# Patient Record
Sex: Female | Born: 1970 | Race: Black or African American | Hispanic: No | Marital: Single | Smoking: Current some day smoker
Health system: Southern US, Community
[De-identification: ages and names within clinical notes are randomized; demographics above are authoritative.]

## PROBLEM LIST (undated history)

## (undated) DIAGNOSIS — D219 Benign neoplasm of connective and other soft tissue, unspecified: Secondary | ICD-10-CM

## (undated) DIAGNOSIS — T7840XA Allergy, unspecified, initial encounter: Secondary | ICD-10-CM

## (undated) DIAGNOSIS — G43909 Migraine, unspecified, not intractable, without status migrainosus: Secondary | ICD-10-CM

## (undated) DIAGNOSIS — M1990 Unspecified osteoarthritis, unspecified site: Secondary | ICD-10-CM

## (undated) DIAGNOSIS — N390 Urinary tract infection, site not specified: Secondary | ICD-10-CM

## (undated) DIAGNOSIS — R51 Headache: Secondary | ICD-10-CM

## (undated) DIAGNOSIS — R519 Headache, unspecified: Secondary | ICD-10-CM

## (undated) DIAGNOSIS — D649 Anemia, unspecified: Secondary | ICD-10-CM

## (undated) DIAGNOSIS — K219 Gastro-esophageal reflux disease without esophagitis: Secondary | ICD-10-CM

## (undated) HISTORY — DX: Migraine, unspecified, not intractable, without status migrainosus: G43.909

## (undated) HISTORY — DX: Headache, unspecified: R51.9

## (undated) HISTORY — DX: Urinary tract infection, site not specified: N39.0

## (undated) HISTORY — DX: Allergy, unspecified, initial encounter: T78.40XA

## (undated) HISTORY — PX: TUBAL LIGATION: SHX77

## (undated) HISTORY — DX: Gastro-esophageal reflux disease without esophagitis: K21.9

## (undated) HISTORY — DX: Headache: R51

---

## 1997-12-01 ENCOUNTER — Emergency Department (HOSPITAL_COMMUNITY): Admission: EM | Admit: 1997-12-01 | Discharge: 1997-12-01 | Payer: Self-pay | Admitting: Emergency Medicine

## 1998-03-04 ENCOUNTER — Emergency Department (HOSPITAL_COMMUNITY): Admission: EM | Admit: 1998-03-04 | Discharge: 1998-03-04 | Payer: Self-pay | Admitting: Emergency Medicine

## 1998-06-07 ENCOUNTER — Emergency Department (HOSPITAL_COMMUNITY): Admission: EM | Admit: 1998-06-07 | Discharge: 1998-06-07 | Payer: Self-pay | Admitting: Emergency Medicine

## 1998-10-27 ENCOUNTER — Emergency Department (HOSPITAL_COMMUNITY): Admission: EM | Admit: 1998-10-27 | Discharge: 1998-10-27 | Payer: Self-pay | Admitting: Emergency Medicine

## 1999-01-11 ENCOUNTER — Encounter: Payer: Self-pay | Admitting: *Deleted

## 1999-01-11 ENCOUNTER — Ambulatory Visit (HOSPITAL_COMMUNITY): Admission: RE | Admit: 1999-01-11 | Discharge: 1999-01-11 | Payer: Self-pay | Admitting: *Deleted

## 1999-03-27 ENCOUNTER — Emergency Department (HOSPITAL_COMMUNITY): Admission: EM | Admit: 1999-03-27 | Discharge: 1999-03-27 | Payer: Self-pay | Admitting: *Deleted

## 1999-05-10 ENCOUNTER — Observation Stay (HOSPITAL_COMMUNITY): Admission: AD | Admit: 1999-05-10 | Discharge: 1999-05-11 | Payer: Self-pay | Admitting: *Deleted

## 1999-05-11 ENCOUNTER — Encounter: Payer: Self-pay | Admitting: *Deleted

## 1999-06-24 ENCOUNTER — Inpatient Hospital Stay (HOSPITAL_COMMUNITY): Admission: AD | Admit: 1999-06-24 | Discharge: 1999-06-26 | Payer: Self-pay | Admitting: *Deleted

## 2000-10-04 ENCOUNTER — Encounter: Payer: Self-pay | Admitting: Emergency Medicine

## 2000-10-04 ENCOUNTER — Emergency Department (HOSPITAL_COMMUNITY): Admission: EM | Admit: 2000-10-04 | Discharge: 2000-10-04 | Payer: Self-pay | Admitting: Emergency Medicine

## 2001-07-17 ENCOUNTER — Emergency Department (HOSPITAL_COMMUNITY): Admission: EM | Admit: 2001-07-17 | Discharge: 2001-07-17 | Payer: Self-pay | Admitting: Emergency Medicine

## 2001-07-17 ENCOUNTER — Encounter: Payer: Self-pay | Admitting: Emergency Medicine

## 2001-09-27 ENCOUNTER — Encounter: Payer: Self-pay | Admitting: Emergency Medicine

## 2001-09-27 ENCOUNTER — Emergency Department (HOSPITAL_COMMUNITY): Admission: EM | Admit: 2001-09-27 | Discharge: 2001-09-27 | Payer: Self-pay | Admitting: Emergency Medicine

## 2001-11-04 ENCOUNTER — Encounter: Admission: RE | Admit: 2001-11-04 | Discharge: 2001-11-04 | Payer: Self-pay | Admitting: Internal Medicine

## 2001-11-04 ENCOUNTER — Ambulatory Visit (HOSPITAL_COMMUNITY): Admission: RE | Admit: 2001-11-04 | Discharge: 2001-11-04 | Payer: Self-pay | Admitting: Internal Medicine

## 2001-11-04 ENCOUNTER — Encounter: Payer: Self-pay | Admitting: Internal Medicine

## 2001-11-11 ENCOUNTER — Encounter: Admission: RE | Admit: 2001-11-11 | Discharge: 2001-11-11 | Payer: Self-pay | Admitting: Internal Medicine

## 2001-11-28 ENCOUNTER — Emergency Department (HOSPITAL_COMMUNITY): Admission: EM | Admit: 2001-11-28 | Discharge: 2001-11-28 | Payer: Self-pay | Admitting: Emergency Medicine

## 2001-12-25 ENCOUNTER — Emergency Department (HOSPITAL_COMMUNITY): Admission: EM | Admit: 2001-12-25 | Discharge: 2001-12-26 | Payer: Self-pay | Admitting: Emergency Medicine

## 2002-07-20 ENCOUNTER — Inpatient Hospital Stay (HOSPITAL_COMMUNITY): Admission: AD | Admit: 2002-07-20 | Discharge: 2002-07-20 | Payer: Self-pay | Admitting: Obstetrics and Gynecology

## 2002-10-17 ENCOUNTER — Other Ambulatory Visit: Admission: RE | Admit: 2002-10-17 | Discharge: 2002-10-17 | Payer: Self-pay | Admitting: Obstetrics and Gynecology

## 2003-02-28 ENCOUNTER — Inpatient Hospital Stay (HOSPITAL_COMMUNITY): Admission: AD | Admit: 2003-02-28 | Discharge: 2003-03-02 | Payer: Self-pay | Admitting: Obstetrics and Gynecology

## 2003-05-04 ENCOUNTER — Other Ambulatory Visit: Admission: RE | Admit: 2003-05-04 | Discharge: 2003-05-04 | Payer: Self-pay | Admitting: Obstetrics and Gynecology

## 2003-05-08 ENCOUNTER — Ambulatory Visit (HOSPITAL_COMMUNITY): Admission: RE | Admit: 2003-05-08 | Discharge: 2003-05-08 | Payer: Self-pay | Admitting: Obstetrics and Gynecology

## 2003-06-21 ENCOUNTER — Emergency Department (HOSPITAL_COMMUNITY): Admission: EM | Admit: 2003-06-21 | Discharge: 2003-06-22 | Payer: Self-pay | Admitting: Emergency Medicine

## 2003-12-24 ENCOUNTER — Emergency Department (HOSPITAL_COMMUNITY): Admission: EM | Admit: 2003-12-24 | Discharge: 2003-12-24 | Payer: Self-pay | Admitting: Emergency Medicine

## 2004-01-11 ENCOUNTER — Emergency Department (HOSPITAL_COMMUNITY): Admission: EM | Admit: 2004-01-11 | Discharge: 2004-01-11 | Payer: Self-pay | Admitting: Emergency Medicine

## 2004-11-02 ENCOUNTER — Emergency Department (HOSPITAL_COMMUNITY): Admission: EM | Admit: 2004-11-02 | Discharge: 2004-11-02 | Payer: Self-pay | Admitting: Emergency Medicine

## 2005-04-21 ENCOUNTER — Emergency Department (HOSPITAL_COMMUNITY): Admission: EM | Admit: 2005-04-21 | Discharge: 2005-04-21 | Payer: Self-pay | Admitting: Emergency Medicine

## 2006-11-01 ENCOUNTER — Emergency Department (HOSPITAL_COMMUNITY): Admission: EM | Admit: 2006-11-01 | Discharge: 2006-11-01 | Payer: Self-pay | Admitting: Emergency Medicine

## 2007-01-28 ENCOUNTER — Emergency Department (HOSPITAL_COMMUNITY): Admission: EM | Admit: 2007-01-28 | Discharge: 2007-01-28 | Payer: Self-pay | Admitting: Emergency Medicine

## 2007-08-07 ENCOUNTER — Emergency Department (HOSPITAL_COMMUNITY): Admission: EM | Admit: 2007-08-07 | Discharge: 2007-08-07 | Payer: Self-pay | Admitting: Emergency Medicine

## 2008-01-26 ENCOUNTER — Emergency Department (HOSPITAL_COMMUNITY): Admission: EM | Admit: 2008-01-26 | Discharge: 2008-01-26 | Payer: Self-pay | Admitting: Emergency Medicine

## 2008-06-13 ENCOUNTER — Emergency Department (HOSPITAL_COMMUNITY): Admission: EM | Admit: 2008-06-13 | Discharge: 2008-06-13 | Payer: Self-pay | Admitting: Emergency Medicine

## 2010-07-18 LAB — URINALYSIS, ROUTINE W REFLEX MICROSCOPIC
Nitrite: NEGATIVE
Specific Gravity, Urine: 1.03 (ref 1.005–1.030)
pH: 6.5 (ref 5.0–8.0)

## 2010-07-18 LAB — GC/CHLAMYDIA PROBE AMP, GENITAL: GC Probe Amp, Genital: NEGATIVE

## 2010-07-18 LAB — WET PREP, GENITAL
Trich, Wet Prep: NONE SEEN
WBC, Wet Prep HPF POC: NONE SEEN

## 2010-08-23 NOTE — H&P (Signed)
[name], [name] NO.:  [id number]   MEDICAL RECORD NO.:  [id number]                   PATIENT TYPE:   LOCATION:                                       FACILITY:   PHYSICIAN:  Rudy Jew. Ashley Royalty, M.D.             DATE OF BIRTH:   DATE OF ADMISSION:  05/08/2003  DATE OF DISCHARGE:                                HISTORY & PHYSICAL   This is a 40 year old gravida 7, para 6-0-1-6, who states a desire for  attempt at permanent surgical sterilization.   MEDICATIONS:  None.   PAST MEDICAL HISTORY:  Medical:  Negative.  Surgical:  Negative.   ALLERGIES:  Negative.   FAMILY HISTORY:  Positive for diabetes.   SOCIAL HISTORY:  The patient denies use of tobacco or significant alcohol.   REVIEW OF SYSTEMS:  Noncontributory.   PHYSICAL EXAMINATION:  GENERAL:  A well-developed, well-nourished, pleasant  female in no acute distress, afebrile, with vital signs stable.  SKIN:  Warm and dry without lesions.  LYMPHATIC:  There is no supraclavicular, cervical, or inguinal adenopathy.  HEENT:  Normocephalic.  NECK:  Without thyromegaly.  CHEST:  Lungs are clear.  CARDIAC:  Regular rate and rhythm without murmurs, gallops, or rubs.  BREASTS:  Deferred.  ABDOMEN:  Soft, nontender, without masses or organomegaly.  Bowel sounds  were active.  MUSCULOSKELETAL:  Full range of motion without edema, cyanosis, or CVA  tenderness.  PELVIC (January 27):  External genitalia within normal limits.  Vagina and  cervix are without gross lesions.  Bimanual examination reveals the uterus  to be approximately 8 x 4 x 4 cm, and no adnexal masses were palpable.   IMPRESSION:  Desire for attempt at permanent surgical sterilization.   PLAN:  Laparoscopic bilateral tubal sterilization procedure.  Risks,  benefits, complications, and alternatives fully discussed with the patient.  Permanency and failure rates of various techniques, including but not  limited to Falope ring,  bipolar cautery, mini-laparotomy with partial  salpingectomy, discussed and accepted.  Questions invited and answered.                                               James A. Ashley Royalty, M.D.   JAM/MEDQ  D:  05/08/2003  T:  05/08/2003  Job:  [id number]

## 2010-08-23 NOTE — Op Note (Signed)
[name], [name]                         ACCOUNT NO.:  [id number]   MEDICAL RECORD NO.:  [id number]                   PATIENT TYPE:   LOCATION:                                       FACILITY:  [facility]   PHYSICIAN:  James A. Ashley Royalty, M.D.             DATE OF BIRTH:  02-Oct-1970   DATE OF PROCEDURE:  05/08/2003  DATE OF DISCHARGE:                                 OPERATIVE REPORT   PREOPERATIVE DIAGNOSIS:  Desire for attempt at permanent surgical  sterilization.   POSTOPERATIVE DIAGNOSIS:  Desire for attempt at permanent surgical  sterilization.   PROCEDURE:  Laparoscopic bilateral tubal sterilization procedure (Falope  rings).   SURGEON:  Rudy Jew. Ashley Royalty, M.D.   ANESTHESIA:  General.   ESTIMATED BLOOD LOSS:  Less than 25 mL.   COMPLICATIONS:  None.   PACKS AND DRAINS:  None.   DESCRIPTION OF PROCEDURE:  The patient was taken to the operating room and  placed in the dorsal supine position.  After adequate general anesthesia was  administered, she was placed in the lithotomy position and prepped and  draped in the usual manner for vaginal surgery.  A posterior weighted  retractor was placed per vagina.  The anterior lip of the cervix was grasped  with a single-tooth tenaculum.  The Jarcho uterine manipulator was placed  per cervix and held in place with a tenaculum.  The bladder was drained with  a red rubber catheter.  Next a 1.2 cm infraumbilical incision was made in  the longitudinal plane after 3 mL of Marcaine 0.5% with 1:100,000  epinephrine.  A size 10/11 disposable laparoscopic trocar was placed into  the abdominal cavity.  Its location was verified by placement of the  laparoscope.  There was no evidence of any trauma.  Pneumoperitoneum was  created with CO2 and maintained throughout the procedure.  Next an 8 mm  suprapubic Falope ring trocar was placed into the abdominal cavity using  direct visualization and transillumination techniques.  The pelvis was  thoroughly surveyed.  The uterus was normal size, shape, and contour without  evidence of any fibroids or endometriosis.  The ovaries were normal size,  shape, and contour bilaterally without evidence of any cyst or  endometriosis.  The fallopian tubes were slightly shorter than average but  otherwise normal size, shape, and contour, with luxuriant fimbriae.  The  remainder of the peritoneal surfaces were smooth and glistening.  Appropriate photographs were obtained.  The right fallopian tube was grasped  in the distal isthmic to proximal ampullary portion.  A Falope ring was  applied without difficulty.  An excellent knuckle of tube was noted to be  contained within the ring.  Excellent blanching of tissue was noted.  The  left fallopian tube was then grasped and traced to its fimbriated end.  An  avascular area in the distal isthmic to proximal ampullary portion was  chosen for ring  placement.  A Falope ring was applied without difficulty.  An excellent knuckle of tube was noted to be contained within the ring.  Excellent blanching of tissue was noted.   At this point the patient was felt to have benefitted maximally from the  surgical procedure.  The abdominal instruments were removed and the  pneumoperitoneum evacuated.  The fascial defects were closed with 0 Vicryl  in  interrupted fashion.  The skin was closed with 3-0 Monocryl in a  subcuticular fashion.  Vaginal instruments were removed and hemostasis  noted.   The patient was then taken to the recovery room in excellent condition.                                               James A. Ashley Royalty, M.D.    JAM/MEDQ  D:  05/09/2003  T:  05/10/2003  Job:  [id number]

## 2010-11-20 ENCOUNTER — Other Ambulatory Visit (HOSPITAL_COMMUNITY): Payer: Self-pay | Admitting: Obstetrics and Gynecology

## 2010-11-20 DIAGNOSIS — Z3682 Encounter for antenatal screening for nuchal translucency: Secondary | ICD-10-CM

## 2010-12-10 ENCOUNTER — Ambulatory Visit (HOSPITAL_COMMUNITY): Admission: RE | Admit: 2010-12-10 | Payer: Medicaid Other | Source: Ambulatory Visit

## 2010-12-10 ENCOUNTER — Ambulatory Visit (HOSPITAL_COMMUNITY)
Admission: RE | Admit: 2010-12-10 | Discharge: 2010-12-10 | Disposition: A | Discharge status: Home / Self Care | Payer: Medicaid Other | Source: Ambulatory Visit | Attending: Obstetrics and Gynecology | Admitting: Obstetrics and Gynecology

## 2010-12-10 ENCOUNTER — Encounter (HOSPITAL_COMMUNITY): Payer: Self-pay

## 2010-12-10 DIAGNOSIS — Z3682 Encounter for antenatal screening for nuchal translucency: Secondary | ICD-10-CM

## 2011-01-07 LAB — URINE MICROSCOPIC-ADD ON

## 2011-01-07 LAB — CBC
Hemoglobin: 13.5
MCV: 83.7
Platelets: 182
RBC: 4.84

## 2011-01-07 LAB — DIFFERENTIAL
Basophils Relative: 0
Eosinophils Absolute: 0.1
Eosinophils Relative: 1
Monocytes Relative: 3

## 2011-01-07 LAB — URINALYSIS, ROUTINE W REFLEX MICROSCOPIC
Nitrite: NEGATIVE
Protein, ur: 30 — AB
Specific Gravity, Urine: 1.03
pH: 7.5

## 2011-01-07 LAB — POCT PREGNANCY, URINE: Preg Test, Ur: NEGATIVE

## 2011-01-07 LAB — POCT I-STAT, CHEM 8: Calcium, Ion: 1.14

## 2011-01-15 LAB — BASIC METABOLIC PANEL
CO2: 27
Chloride: 103
Creatinine, Ser: 0.85
GFR calc Af Amer: >60
Glucose, Bld: 100 — ABNORMAL HIGH
Potassium: 4.1
Sodium: 135

## 2011-01-15 LAB — DIFFERENTIAL
Basophils Absolute: 0.2 — ABNORMAL HIGH
Eosinophils Absolute: 0.2
Eosinophils Relative: 2
Monocytes Absolute: 0.6
Neutro Abs: 4.5

## 2011-01-15 LAB — OCCULT BLOOD X 1 CARD TO LAB, STOOL: Fecal Occult Bld: NEGATIVE

## 2011-01-15 LAB — CBC
HCT: 38.7
Hemoglobin: 12.7
RDW: 16.6 — ABNORMAL HIGH

## 2011-01-20 LAB — BASIC METABOLIC PANEL
Chloride: 106
GFR calc Af Amer: >60
GFR calc non Af Amer: >60
Glucose, Bld: 100 — ABNORMAL HIGH
Sodium: 137

## 2011-01-20 LAB — DIFFERENTIAL
Basophils Absolute: 0
Basophils Relative: 1
Lymphs Abs: 2.3
Monocytes Absolute: 0.5

## 2011-01-20 LAB — CBC
RBC: 4.5
RDW: 15.2 — ABNORMAL HIGH

## 2011-01-20 LAB — POCT CARDIAC MARKERS
CKMB, poc: <1 — ABNORMAL LOW
Troponin i, poc: <0.05

## 2011-06-15 ENCOUNTER — Emergency Department (HOSPITAL_COMMUNITY)
Admission: EM | Admit: 2011-06-15 | Discharge: 2011-06-15 | Disposition: A | Discharge status: Home / Self Care | Payer: Self-pay | Attending: Emergency Medicine | Admitting: Emergency Medicine

## 2011-06-15 ENCOUNTER — Encounter (HOSPITAL_COMMUNITY): Payer: Self-pay | Admitting: Emergency Medicine

## 2011-06-15 DIAGNOSIS — H00019 Hordeolum externum unspecified eye, unspecified eyelid: Secondary | ICD-10-CM | POA: Insufficient documentation

## 2011-06-15 DIAGNOSIS — F17200 Nicotine dependence, unspecified, uncomplicated: Secondary | ICD-10-CM | POA: Insufficient documentation

## 2011-06-15 MED ORDER — ERYTHROMYCIN 5 MG/GM OP OINT
1.0000 "application " | TOPICAL_OINTMENT | Freq: Once | OPHTHALMIC | Status: AC
  Administered 2011-06-15: 1 via OPHTHALMIC
  Filled 2011-06-15: qty 1

## 2011-06-15 NOTE — ED Provider Notes (Signed)
History     CSN: [id number]  Arrival date & time 06/15/11  1107   First MD Initiated Contact with Patient 06/15/11 1335      Chief Complaint  Patient presents with  . Facial Swelling  . Headache     HPI Patient presents with 4 days of discomfort about the left face and headaches.  She notes that her symptoms began gradually.  She's had persistent discomfort.  Notably, the patient has fullness of her upper eyelid on the left.  She denies any visual acuity changes, any foreign body sensation, any actual eye pain.  There is discomfort on the superior lateral lid, which described as sore.  She has not achieved relief with compresses.  She denies any change in cosmetics or other medications. No fevers, no chills, no vomiting, no confusion, no disorientation. History reviewed. No pertinent past medical history.  Past Surgical History  Procedure Date  . Tubal ligation     History reviewed. No pertinent family history.  History  Substance Use Topics  . Smoking status: Current Some Day Smoker  . Smokeless tobacco: Not on file  . Alcohol Use: No    OB History    Grav Para Term Preterm Abortions TAB SAB Ect Mult Living   0 0 0 0 0 0 0 0 0 0       Review of Systems  All other systems reviewed and are negative.    Allergies  Review of patient's allergies indicates no known allergies.  Home Medications   Current Outpatient Rx  Name Route Sig Dispense Refill  . BENADRYL PO Oral Take 1 tablet by mouth daily.      BP 122/65  Pulse 77  Temp(Src) 97.9 F (36.6 C) (Oral)  Resp 20  SpO2 100%  LMP 06/01/2011  Physical Exam  Nursing note and vitals reviewed. Constitutional: She is oriented to person, place, and time. She appears well-developed and well-nourished.  HENT:  Head: Normocephalic and atraumatic.  Eyes: Conjunctivae and EOM are normal. Pupils are equal, round, and reactive to light. Right eye exhibits no chemosis, no discharge, no exudate and no hordeolum. No  foreign body present in the right eye. Left eye exhibits hordeolum. Left eye exhibits no chemosis, no discharge and no exudate. No foreign body present in the left eye. No scleral icterus.    Cardiovascular: Normal rate and regular rhythm.   Pulmonary/Chest: Effort normal and breath sounds normal. No respiratory distress.  Neurological: She is alert and oriented to person, place, and time. No cranial nerve deficit. She exhibits normal muscle tone. Coordination normal.  Skin: Skin is warm and dry. She is not diaphoretic.  Psychiatric: She has a normal mood and affect.    ED Course  Procedures (including critical care time)  Labs Reviewed - No data to display No results found.   1. Hordeolum       MDM  This generally well female presents with 4 days of facial discomfort and on exam has a left eye hordeolum.  The patient acuity changes, her absence of distress, the unremarkable vital signs are all reassuring.  Patient was counseled on the necessity warm compresses, appropriate ophthalmologic followup as needed.  She was discharged in stable condition.       Gerhard Munch, MD 06/15/11 1350

## 2011-06-15 NOTE — Discharge Instructions (Signed)
Sty  A sty (hordeolum) is an infection of a gland in the eyelid located at the base of the eyelash. A sty may develop a white or yellow head of pus. It can be puffy (swollen). Usually, the sty will burst and pus will come out on its own. They do not leave lumps in the eyelid once they drain.  A sty is often confused with another form of cyst of the eyelid called a chalazion. Chalazions occur within the eyelid and not on the edge where the bases of the eyelashes are. They often are red, sore and then form firm lumps in the eyelid.  CAUSES    Germs (bacteria).   Lasting (chronic) eyelid inflammation.  SYMPTOMS    Tenderness, redness and swelling along the edge of the eyelid at the base of the eyelashes.   Sometimes, there is a white or yellow head of pus. It may or may not drain.  DIAGNOSIS   An ophthalmologist will be able to distinguish between a sty and a chalazion and treat the condition appropriately.   TREATMENT    Styes are typically treated with warm packs (compresses) until drainage occurs.   In rare cases, medicines that kill germs (antibiotics) may be prescribed. These antibiotics may be in the form of drops, cream or pills.   If a hard lump has formed, it is generally necessary to do a small incision and remove the hardened contents of the cyst in a minor surgical procedure done in the office.   In suspicious cases, your caregiver may send the contents of the cyst to the lab to be certain that it is not a rare, but dangerous form of cancer of the glands of the eyelid.  HOME CARE INSTRUCTIONS    Wash your hands often and dry them with a clean towel. Avoid touching your eyelid. This may spread the infection to other parts of the eye.   Apply heat to your eyelid for 10 to 20 minutes, several times a day, to ease pain and help to heal it faster.   Do not squeeze the sty. Allow it to drain on its own. Wash your eyelid carefully 3 to 4 times per day to remove any pus.  SEEK IMMEDIATE MEDICAL CARE IF:     Your eye becomes painful or puffy (swollen).   Your vision changes.   Your sty does not drain by itself within 3 days.   Your sty comes back within a short period of time, even with treatment.   You have redness (inflammation) around the eye.   You have a fever.  Document Released: 01/01/2005 Document Revised: 03/13/2011 Document Reviewed: 09/05/2008  [employer] Patient Information 2012 [employer].

## 2011-06-15 NOTE — ED Notes (Signed)
Pt reports swelling to face and headache onset 4 days ago. Pt denies changes in use of products.

## 2011-12-26 ENCOUNTER — Encounter (HOSPITAL_COMMUNITY): Payer: Self-pay | Admitting: Emergency Medicine

## 2011-12-26 ENCOUNTER — Emergency Department (HOSPITAL_COMMUNITY): Payer: Self-pay

## 2011-12-26 ENCOUNTER — Emergency Department (HOSPITAL_COMMUNITY)
Admission: EM | Admit: 2011-12-26 | Discharge: 2011-12-26 | Disposition: A | Discharge status: Home / Self Care | Payer: Self-pay | Attending: Emergency Medicine | Admitting: Emergency Medicine

## 2011-12-26 DIAGNOSIS — F17200 Nicotine dependence, unspecified, uncomplicated: Secondary | ICD-10-CM | POA: Insufficient documentation

## 2011-12-26 DIAGNOSIS — W230XXA Caught, crushed, jammed, or pinched between moving objects, initial encounter: Secondary | ICD-10-CM | POA: Insufficient documentation

## 2011-12-26 DIAGNOSIS — S60229A Contusion of unspecified hand, initial encounter: Secondary | ICD-10-CM | POA: Insufficient documentation

## 2011-12-26 MED ORDER — IBUPROFEN 600 MG PO TABS: 600.0000 mg | ORAL_TABLET | Freq: Four times a day (QID) | ORAL | Status: DC | PRN

## 2011-12-26 MED ORDER — IBUPROFEN 400 MG PO TABS
600.0000 mg | ORAL_TABLET | Freq: Once | ORAL | Status: AC
  Administered 2011-12-26: 600 mg via ORAL
  Filled 2011-12-26: qty 1

## 2011-12-26 NOTE — ED Provider Notes (Signed)
History     CSN: [id number]  Arrival date & time 12/26/11  1931   First MD Initiated Contact with Patient 12/26/11 1955      Chief Complaint  Patient presents with  . Hand Pain    (Consider location/radiation/quality/duration/timing/severity/associated sxs/prior treatment) HPI Comments: Patient states, 5 minutes before she arrived in the emergency department.  She caught her hand in the elevator doors.  She is now complaining of pain and swelling to the right hand at the base of the thumb.  She has not taken any medication prior to arrival  Patient is a 41 y.o. female presenting with hand pain. The history is provided by the patient.  Hand Pain This is a new problem. The current episode started today. The problem has been unchanged. Pertinent negatives include no diaphoresis, fever, joint swelling, numbness or weakness. The symptoms are aggravated by exertion. She has tried nothing for the symptoms.    History reviewed. No pertinent past medical history.  Past Surgical History  Procedure Date  . Tubal ligation     No family history on file.  History  Substance Use Topics  . Smoking status: Current Some Day Smoker  . Smokeless tobacco: Not on file  . Alcohol Use: No    OB History    Grav Para Term Preterm Abortions TAB SAB Ect Mult Living   0 0 0 0 0 0 0 0 0 0       Review of Systems  Constitutional: Negative for fever and diaphoresis.  Musculoskeletal: Negative for joint swelling.  Skin: Negative for wound.  Neurological: Negative for dizziness, weakness and numbness.    Allergies  Review of patient's allergies indicates no known allergies.  Home Medications   Current Outpatient Rx  Name Route Sig Dispense Refill  . BENADRYL PO Oral Take 1 tablet by mouth daily.    . IBUPROFEN 600 MG PO TABS Oral Take 1 tablet (600 mg total) by mouth every 6 (six) hours as needed for pain. 30 tablet 0    BP 132/61  Pulse 77  Temp 98.7 F (37.1 C) (Oral)  Resp 18  SpO2  100%  LMP 12/14/2011  Physical Exam  Constitutional: She appears well-developed and well-nourished.  Eyes: Pupils are equal, round, and reactive to light.  Neck: Normal range of motion.  Cardiovascular: Normal rate.   Pulmonary/Chest: Effort normal.  Musculoskeletal: She exhibits tenderness. She exhibits no edema.       Right wrist: She exhibits normal range of motion, no tenderness, no bony tenderness, no swelling and no deformity.       Slight swelling, and tenderness at the base of the right thumb.  Her in the skin.  No bruising, abrasions. Full range of motion, distal cap refill less than 3 seconds  Neurological: She is alert.  Skin: Skin is warm.    ED Course  Procedures (including critical care time)  Labs Reviewed - No data to display Dg Hand Complete Right  12/26/2011  *RADIOLOGY REPORT*  Clinical Data: Hand pain post injury  RIGHT HAND - COMPLETE 3+ VIEW  Comparison: None.  Findings: Three views of the right hand submitted.  No acute fracture or subluxation.  Mild osteopenia.  IMPRESSION: No acute fracture or subluxation.  Mild osteopenia.   Original Report Authenticated By: Natasha Mead, M.D.      1. Hand contusion       MDM  I reviewed.  This patient's x-ray, there is no fracture or subluxation.  Minimal swelling, or tenderness  to feel that this is a contusion, will prescribe cryotherapy as well as ibuprofen        Arman Filter, NP 12/26/11 2109

## 2011-12-26 NOTE — ED Notes (Signed)
Pt alert and oriented, with steady gait at time of discharge. Pt given discharge papers and papers explained. All questions answered and pt walked to discharge.  

## 2011-12-26 NOTE — ED Notes (Signed)
Pt states elevator door closed on R hand approx 5 min ago.  C/o pain to R hand. No obvious injury noted on triage exam.

## 2011-12-28 NOTE — ED Provider Notes (Signed)
Medical screening examination/treatment/procedure(s) were performed by non-physician practitioner and as supervising physician I was immediately available for consultation/collaboration.  Daysia Vandenboom L Cobi Aldape, MD 12/28/11 0111 

## 2013-07-02 ENCOUNTER — Encounter (HOSPITAL_COMMUNITY): Payer: Self-pay | Admitting: Emergency Medicine

## 2013-07-02 ENCOUNTER — Emergency Department (HOSPITAL_COMMUNITY)
Admission: EM | Admit: 2013-07-02 | Discharge: 2013-07-02 | Disposition: A | Discharge status: Home / Self Care | Payer: No Typology Code available for payment source | Attending: Emergency Medicine | Admitting: Emergency Medicine

## 2013-07-02 DIAGNOSIS — Z3202 Encounter for pregnancy test, result negative: Secondary | ICD-10-CM | POA: Insufficient documentation

## 2013-07-02 DIAGNOSIS — R059 Cough, unspecified: Secondary | ICD-10-CM | POA: Insufficient documentation

## 2013-07-02 DIAGNOSIS — M545 Low back pain, unspecified: Secondary | ICD-10-CM | POA: Insufficient documentation

## 2013-07-02 DIAGNOSIS — R42 Dizziness and giddiness: Secondary | ICD-10-CM | POA: Insufficient documentation

## 2013-07-02 DIAGNOSIS — K529 Noninfective gastroenteritis and colitis, unspecified: Secondary | ICD-10-CM

## 2013-07-02 DIAGNOSIS — F17200 Nicotine dependence, unspecified, uncomplicated: Secondary | ICD-10-CM | POA: Insufficient documentation

## 2013-07-02 DIAGNOSIS — K089 Disorder of teeth and supporting structures, unspecified: Secondary | ICD-10-CM | POA: Insufficient documentation

## 2013-07-02 DIAGNOSIS — K029 Dental caries, unspecified: Secondary | ICD-10-CM | POA: Insufficient documentation

## 2013-07-02 DIAGNOSIS — R05 Cough: Secondary | ICD-10-CM | POA: Insufficient documentation

## 2013-07-02 DIAGNOSIS — K5289 Other specified noninfective gastroenteritis and colitis: Secondary | ICD-10-CM | POA: Insufficient documentation

## 2013-07-02 DIAGNOSIS — Z9851 Tubal ligation status: Secondary | ICD-10-CM | POA: Insufficient documentation

## 2013-07-02 LAB — URINALYSIS, ROUTINE W REFLEX MICROSCOPIC
Bilirubin Urine: NEGATIVE
GLUCOSE, UA: NEGATIVE mg/dL
Ketones, ur: NEGATIVE mg/dL
LEUKOCYTES UA: NEGATIVE
Nitrite: NEGATIVE
PH: 5.5 (ref 5.0–8.0)
PROTEIN: NEGATIVE mg/dL
Specific Gravity, Urine: 1.021 (ref 1.005–1.030)
Urobilinogen, UA: 0.2 mg/dL (ref 0.0–1.0)

## 2013-07-02 LAB — I-STAT CHEM 8, ED
BUN: 5 mg/dL — AB (ref 6–23)
CALCIUM ION: 1.11 mmol/L — AB (ref 1.12–1.23)
CHLORIDE: 107 meq/L (ref 96–112)
CREATININE: 0.8 mg/dL (ref 0.50–1.10)
Glucose, Bld: 107 mg/dL — ABNORMAL HIGH (ref 70–99)
HCT: 37 % (ref 36.0–46.0)
Hemoglobin: 12.6 g/dL (ref 12.0–15.0)
Potassium: 3.8 mEq/L (ref 3.7–5.3)
SODIUM: 139 meq/L (ref 137–147)
TCO2: 22 mmol/L (ref 0–100)

## 2013-07-02 LAB — PREGNANCY, URINE: Preg Test, Ur: NEGATIVE

## 2013-07-02 LAB — URINE MICROSCOPIC-ADD ON

## 2013-07-02 MED ORDER — HYDROMORPHONE HCL PF 1 MG/ML IJ SOLN
1.0000 mg | Freq: Once | INTRAMUSCULAR | Status: AC
  Administered 2013-07-02: 1 mg via INTRAVENOUS
  Filled 2013-07-02: qty 1

## 2013-07-02 MED ORDER — SODIUM CHLORIDE 0.9 % IV BOLUS (SEPSIS)
1000.0000 mL | Freq: Once | INTRAVENOUS | Status: AC
  Administered 2013-07-02: 1000 mL via INTRAVENOUS

## 2013-07-02 MED ORDER — METOCLOPRAMIDE HCL 10 MG PO TABS: 10.0000 mg | ORAL_TABLET | Freq: Four times a day (QID) | ORAL | Status: DC | PRN

## 2013-07-02 MED ORDER — METOCLOPRAMIDE HCL 5 MG/ML IJ SOLN
10.0000 mg | Freq: Once | INTRAMUSCULAR | Status: AC
  Administered 2013-07-02: 10 mg via INTRAVENOUS
  Filled 2013-07-02: qty 2

## 2013-07-02 NOTE — ED Notes (Signed)
Pt states she began having n/v/d since yesterday. Pt states 2 of her teeth fell out day before that. Pt states she has had R flank pain for 2 days. Denies dysuria.

## 2013-07-02 NOTE — Discharge Instructions (Signed)
Diet for Diarrhea, Adult Take Tylenol as directed for pain. Take the medication prescribed as needed for nausea. Use Imodium as directed for diarrhea. Call the wellness Center or any of the numbers on the resource guide to get a primary care physician. Call Dr. Mariel Sleet schedule an office visit for your bad teeth or you can go to the free dental clinic on April 18,2015 Frequent, runny stools (diarrhea) may be caused or worsened by food or drink. Diarrhea may be relieved by changing your diet. Since diarrhea can last up to 7 days, it is easy for you to lose too much fluid from the body and become dehydrated. Fluids that are lost need to be replaced. Along with a modified diet, make sure you drink enough fluids to keep your urine clear or pale yellow. DIET INSTRUCTIONS  Ensure adequate fluid intake (hydration): have 1 cup (8 oz) of fluid for each diarrhea episode. Avoid fluids that contain simple sugars or sports drinks, fruit juices, whole milk products, and sodas. Your urine should be clear or pale yellow if you are drinking enough fluids. Hydrate with an oral rehydration solution that you can purchase at pharmacies, retail stores, and online. You can prepare an oral rehydration solution at home by mixing the following ingredients together:    tsp table salt.   tsp baking soda.   tsp salt substitute containing potassium chloride.  1  tablespoons sugar.  1 L (34 oz) of water.  Certain foods and beverages may increase the speed at which food moves through the gastrointestinal (GI) tract. These foods and beverages should be avoided and include:  Caffeinated and alcoholic beverages.  High-fiber foods, such as raw fruits and vegetables, nuts, seeds, and whole grain breads and cereals.  Foods and beverages sweetened with sugar alcohols, such as xylitol, sorbitol, and mannitol.  Some foods may be well tolerated and may help thicken stool including:  Starchy foods, such as rice, toast, pasta,  low-sugar cereal, oatmeal, grits, baked potatoes, crackers, and bagels.   Bananas.   Applesauce.  Add probiotic-rich foods to help increase healthy bacteria in the GI tract, such as yogurt and fermented milk products. RECOMMENDED FOODS AND BEVERAGES Starches Choose foods with less than 2 g of fiber per serving.  Recommended:  White, [place], and pita breads, plain rolls, buns, bagels. Plain muffins, matzo. Soda, saltine, or graham crackers. Pretzels, melba toast, zwieback. Cooked cereals made with water: cornmeal, farina, cream cereals. Dry cereals: refined corn, wheat, rice. Potatoes prepared any way without skins, refined macaroni, spaghetti, noodles, refined rice.  Avoid:  Bread, rolls, or crackers made with whole wheat, multi-grains, rye, bran seeds, nuts, or coconut. Corn tortillas or taco shells. Cereals containing whole grains, multi-grains, bran, coconut, nuts, raisins. Cooked or dry oatmeal. Coarse wheat cereals, granola. Cereals advertised as "high-fiber." Potato skins. Whole grain pasta, wild or brown rice. Popcorn. Sweet potatoes, yams. Sweet rolls, doughnuts, waffles, pancakes, sweet breads. Vegetables  Recommended: Strained tomato and vegetable juices. Most well-cooked and canned vegetables without seeds. Fresh: Tender lettuce, cucumber without the skin, cabbage, spinach, bean sprouts.  Avoid: Fresh, cooked, or canned: Artichokes, baked beans, beet greens, broccoli, Brussels sprouts, corn, kale, legumes, peas, sweet potatoes. Cooked: Green or red cabbage, spinach. Avoid large servings of any vegetables because vegetables shrink when cooked, and they contain more fiber per serving than fresh vegetables. Fruit  Recommended: Cooked or canned: Apricots, applesauce, cantaloupe, cherries, fruit cocktail, grapefruit, grapes, kiwi, mandarin oranges, peaches, pears, plums, watermelon. Fresh: Apples without skin, ripe banana,  grapes, cantaloupe, cherries, grapefruit, peaches, oranges,  plums. Keep servings limited to  cup or 1 piece.  Avoid: Fresh: Apples with skin, apricots, mangoes, pears, raspberries, strawberries. Prune juice, stewed or dried prunes. Dried fruits, raisins, dates. Large servings of all fresh fruits. Protein  Recommended: Ground or well-cooked tender beef, ham, veal, lamb, pork, or poultry. Eggs. Fish, oysters, shrimp, lobster, other seafoods. Liver, organ meats.  Avoid: Tough, fibrous meats with gristle. Peanut butter, smooth or chunky. Cheese, nuts, seeds, legumes, dried peas, beans, lentils. Dairy  Recommended: Yogurt, lactose-free milk, kefir, drinkable yogurt, buttermilk, soy milk, or plain hard cheese.  Avoid: Milk, chocolate milk, beverages made with milk, such as milkshakes. Soups  Recommended: Bouillon, broth, or soups made from allowed foods. Any strained soup.  Avoid: Soups made from vegetables that are not allowed, cream or milk-based soups. Desserts and Sweets  Recommended: Sugar-free gelatin, sugar-free frozen ice pops made without sugar alcohol.  Avoid: Plain cakes and cookies, pie made with fruit, pudding, custard, cream pie. Gelatin, fruit, ice, sherbet, frozen ice pops. Ice cream, ice milk without nuts. Plain hard candy, honey, jelly, molasses, syrup, sugar, chocolate syrup, gumdrops, marshmallows. Fats and Oils  Recommended: Limit fats to less than 8 tsp per day.  Avoid: Seeds, nuts, olives, avocados. Margarine, butter, cream, mayonnaise, salad oils, plain salad dressings. Plain gravy, crisp bacon without rind. Beverages  Recommended: Water, decaffeinated teas, oral rehydration solutions, sugar-free beverages not sweetened with sugar alcohols.  Avoid: Fruit juices, caffeinated beverages (coffee, tea, soda), alcohol, sports drinks, or lemon-lime soda. Condiments  Recommended: Ketchup, mustard, horseradish, vinegar, cocoa powder. Spices in moderation: allspice, basil, bay leaves, celery powder or leaves, cinnamon, cumin  powder, curry powder, ginger, mace, marjoram, onion or garlic powder, oregano, paprika, parsley flakes, ground pepper, rosemary, sage, savory, tarragon, thyme, turmeric.  Avoid: Coconut, honey. Document Released: 06/14/2003 Document Revised: 12/17/2011 Document Reviewed: 08/08/2011 [facility] Patient Information 2014 [employer].

## 2013-07-02 NOTE — ED Provider Notes (Addendum)
CSN: [id number]     Arrival date & time 07/02/13  1247 History   First MD Initiated Contact with Patient 07/02/13 1313     Chief Complaint  Patient presents with  . Emesis  . Diarrhea  . Dental Problem     (Consider location/radiation/quality/duration/timing/severity/associated sxs/prior Treatment) HPI Patient complains of vomiting possibly 6 times since yesterday and diarrhea 6-7 times since yesterday. Denies fever. Denies hematemesis denies blood per rectum. No recent travel. No recent antibiotics. She also admits to low back pain for 2 days, bilateral mild not made better or worse by anything. Patient also reports that 2 of her teeth fell out within the past 3 days. Upper left incisor and upper left premolar teeth fell out denies dental pain t. She treated herself with Benadryl without relief. She denies nausea presently. Denies abdominal pain. She admits to lightheadedness with standing. No other associated symptoms. History reviewed. No pertinent past medical history. Past Surgical History  Procedure Laterality Date  . Tubal ligation     History reviewed. No pertinent family history. History  Substance Use Topics  . Smoking status: Current Some Day Smoker  . Smokeless tobacco: Not on file  . Alcohol Use: No   OB History   Grav Para Term Preterm Abortions TAB SAB Ect Mult Living   0 0 0 0 0 0 0 0 0 0      Review of Systems  Constitutional: Positive for chills.  HENT: Positive for dental problem.   Respiratory: Positive for cough.        Slight cough  Cardiovascular: Negative.   Gastrointestinal: Positive for vomiting and diarrhea.  Musculoskeletal: Positive for back pain.  Skin: Negative.   Neurological: Negative.   Psychiatric/Behavioral: Negative.   All other systems reviewed and are negative.      Allergies  Review of patient's allergies indicates no known allergies.  Home Medications   Current Outpatient Rx  Name  Route  Sig  Dispense  Refill  .  diphenhydrAMINE (BENADRYL) 25 MG tablet   Oral   Take 25 mg by mouth every 6 (six) hours as needed for allergies.          BP 133/69  Pulse 105  Temp(Src) 98.1 F (36.7 C) (Oral)  Resp 16  SpO2 100% Physical Exam  Nursing note and vitals reviewed. Constitutional: She is oriented to person, place, and time. She appears well-developed and well-nourished. No distress.  HENT:  Head: Normocephalic and atraumatic.  Poor dentition , multiple caries, teeth non tender, no fluctuance of gingivae  Eyes: Conjunctivae are normal. Pupils are equal, round, and reactive to light.  Neck: Neck supple. No tracheal deviation present. No thyromegaly present.  Cardiovascular: Normal rate and regular rhythm.   No murmur heard. Pulmonary/Chest: Effort normal and breath sounds normal.  Abdominal: Soft. Bowel sounds are normal. She exhibits no distension. There is no tenderness.  Musculoskeletal: Normal range of motion. She exhibits no edema and no tenderness.  Lymphadenopathy:    She has no cervical adenopathy.  Neurological: She is alert and oriented to person, place, and time. Coordination normal.  Skin: Skin is warm and dry. No rash noted.  Psychiatric: She has a normal mood and affect.    ED Course  Procedures (including critical care time) Labs Review Labs Reviewed  CBC WITH DIFFERENTIAL  COMPREHENSIVE METABOLIC PANEL  LIPASE, BLOOD  PREGNANCY, URINE  URINALYSIS, ROUTINE W REFLEX MICROSCOPIC   Imaging Review No results found.   EKG Interpretation None  Patient vomited one time in the emergency department. Reglan ordered. Hydromorphone ordered for back pain.  3:30 PM feels much improved after treatment with intravenous fluids, opioids and antiemetics. He had a drink without nausea or vomiting. Feels ready to go home. Results for orders placed during the hospital encounter of 07/02/13  PREGNANCY, URINE      Result Value Ref Range   Preg Test, Ur NEGATIVE  NEGATIVE  URINALYSIS,  ROUTINE W REFLEX MICROSCOPIC      Result Value Ref Range   Color, Urine YELLOW  YELLOW   APPearance CLEAR  CLEAR   Specific Gravity, Urine 1.021  1.005 - 1.030   pH 5.5  5.0 - 8.0   Glucose, UA NEGATIVE  NEGATIVE mg/dL   Hgb urine dipstick TRACE (*) NEGATIVE   Bilirubin Urine NEGATIVE  NEGATIVE   Ketones, ur NEGATIVE  NEGATIVE mg/dL   Protein, ur NEGATIVE  NEGATIVE mg/dL   Urobilinogen, UA 0.2  0.0 - 1.0 mg/dL   Nitrite NEGATIVE  NEGATIVE   Leukocytes, UA NEGATIVE  NEGATIVE  URINE MICROSCOPIC-ADD ON      Result Value Ref Range   Squamous Epithelial / LPF RARE  RARE   WBC, UA 0-2  <3 WBC/hpf   Urine-Other MUCOUS PRESENT    I-STAT CHEM 8, ED      Result Value Ref Range   Sodium 139  137 - 147 mEq/L   Potassium 3.8  3.7 - 5.3 mEq/L   Chloride 107  96 - 112 mEq/L   BUN 5 (*) 6 - 23 mg/dL   Creatinine, Ser 0.80  0.50 - 1.10 mg/dL   Glucose, Bld 107 (*) 70 - 99 mg/dL   Calcium, Ion 1.11 (*) 1.12 - 1.23 mmol/L   TCO2 22  0 - 100 mmol/L   Hemoglobin 12.6  12.0 - 15.0 g/dL   HCT 37.0  36.0 - 46.0 %   No results found.  MDM   Final diagnoses:  None   Plan prescription Reglan. Imodium for diarrhea. Tylenol for pain. Referral [facility] resource guide to get a primary care physician.. Dental  referral Diagnoses #1 gastroenteritis #2 dental caries    Orlie Dakin, MD 07/03/13 [place], MD 07/03/13 [phone]

## 2013-09-12 ENCOUNTER — Emergency Department (HOSPITAL_COMMUNITY)
Admission: EM | Admit: 2013-09-12 | Discharge: 2013-09-12 | Disposition: A | Discharge status: Home / Self Care | Payer: No Typology Code available for payment source | Attending: Emergency Medicine | Admitting: Emergency Medicine

## 2013-09-12 ENCOUNTER — Encounter (HOSPITAL_COMMUNITY): Payer: Self-pay | Admitting: Emergency Medicine

## 2013-09-12 ENCOUNTER — Emergency Department (HOSPITAL_COMMUNITY): Payer: No Typology Code available for payment source

## 2013-09-12 DIAGNOSIS — R0789 Other chest pain: Secondary | ICD-10-CM

## 2013-09-12 DIAGNOSIS — M549 Dorsalgia, unspecified: Secondary | ICD-10-CM | POA: Insufficient documentation

## 2013-09-12 DIAGNOSIS — F17200 Nicotine dependence, unspecified, uncomplicated: Secondary | ICD-10-CM | POA: Insufficient documentation

## 2013-09-12 DIAGNOSIS — Z79899 Other long term (current) drug therapy: Secondary | ICD-10-CM | POA: Insufficient documentation

## 2013-09-12 DIAGNOSIS — R071 Chest pain on breathing: Secondary | ICD-10-CM | POA: Insufficient documentation

## 2013-09-12 DIAGNOSIS — R0602 Shortness of breath: Secondary | ICD-10-CM | POA: Insufficient documentation

## 2013-09-12 LAB — CBC WITH DIFFERENTIAL/PLATELET
BASOS ABS: 0.1 10*3/uL (ref 0.0–0.1)
Basophils Relative: 1 % (ref 0–1)
EOS PCT: 1 % (ref 0–5)
Eosinophils Absolute: 0.1 10*3/uL (ref 0.0–0.7)
HCT: 31.5 % — ABNORMAL LOW (ref 36.0–46.0)
Hemoglobin: 9.7 g/dL — ABNORMAL LOW (ref 12.0–15.0)
LYMPHS ABS: 2.7 10*3/uL (ref 0.7–4.0)
LYMPHS PCT: 37 % (ref 12–46)
MCH: 21.9 pg — ABNORMAL LOW (ref 26.0–34.0)
MCHC: 30.8 g/dL (ref 30.0–36.0)
MCV: 71.1 fL — AB (ref 78.0–100.0)
MONOS PCT: 7 % (ref 3–12)
Monocytes Absolute: 0.5 10*3/uL (ref 0.1–1.0)
NEUTROS PCT: 54 % (ref 43–77)
Neutro Abs: 3.9 10*3/uL (ref 1.7–7.7)
PLATELETS: 161 10*3/uL (ref 150–400)
RBC: 4.43 MIL/uL (ref 3.87–5.11)
RDW: 18.8 % — AB (ref 11.5–15.5)
WBC: 7.3 10*3/uL (ref 4.0–10.5)

## 2013-09-12 LAB — BASIC METABOLIC PANEL
BUN: 5 mg/dL — ABNORMAL LOW (ref 6–23)
CHLORIDE: 105 meq/L (ref 96–112)
CO2: 24 meq/L (ref 19–32)
CREATININE: 0.65 mg/dL (ref 0.50–1.10)
Calcium: 9.3 mg/dL (ref 8.4–10.5)
GFR calc non Af Amer: >90 mL/min (ref >90)
Glucose, Bld: 93 mg/dL (ref 70–99)
POTASSIUM: 4 meq/L (ref 3.7–5.3)
SODIUM: 140 meq/L (ref 137–147)

## 2013-09-12 LAB — I-STAT TROPONIN, ED
TROPONIN I, POC: 0 ng/mL (ref 0.00–0.08)
TROPONIN I, POC: 0 ng/mL (ref 0.00–0.08)

## 2013-09-12 LAB — D-DIMER, QUANTITATIVE: D-Dimer, Quant: <0.27 ug/mL-FEU (ref 0.00–0.48)

## 2013-09-12 MED ORDER — CYCLOBENZAPRINE HCL 10 MG PO TABS: 10.0000 mg | ORAL_TABLET | Freq: Two times a day (BID) | ORAL | Status: DC | PRN

## 2013-09-12 MED ORDER — KETOROLAC TROMETHAMINE 60 MG/2ML IM SOLN
60.0000 mg | Freq: Once | INTRAMUSCULAR | Status: AC
  Administered 2013-09-12: 60 mg via INTRAMUSCULAR
  Filled 2013-09-12: qty 2

## 2013-09-12 MED ORDER — TRAMADOL HCL 50 MG PO TABS: 50.0000 mg | ORAL_TABLET | Freq: Four times a day (QID) | ORAL | Status: DC | PRN

## 2013-09-12 NOTE — ED Notes (Signed)
Pt reports she has recently been sick with nasal congestion.

## 2013-09-12 NOTE — ED Notes (Signed)
Patient transported to X-ray with no signs of distress.  

## 2013-09-12 NOTE — ED Notes (Signed)
Pain in chest worse with movement and deep inspiration; and palpation in certain places. Pa able to replicate. Reports SOB with the pain.

## 2013-09-12 NOTE — ED Notes (Signed)
Pt ambualted to restroom with no signs of distress.

## 2013-09-12 NOTE — ED Notes (Signed)
Tatiyana PA-C at bedside updating patient on results and plan of care

## 2013-09-12 NOTE — ED Notes (Signed)
Pt returned from xray

## 2013-09-12 NOTE — ED Notes (Signed)
Warm pack given for back pain.

## 2013-09-12 NOTE — ED Notes (Signed)
Pt in room and on monitor; PA at bedside.

## 2013-09-12 NOTE — ED Provider Notes (Signed)
CSN: [id number]     Arrival date & time 09/12/13  6578 History   First MD Initiated Contact with Patient 09/12/13 [phone]     Chief Complaint  Patient presents with  . Chest Pain  . Back Pain     (Consider location/radiation/quality/duration/timing/severity/associated sxs/prior Treatment) HPI [name] is a 43 y.o. female who presents to emergency department complaining of right-sided chest and back pain. Patient states she woke up this morning with pain. She states that pain is worsened with movement and deep breaths. She does report some shortness of breath but explains that is because it's painful to breathe. She denies any recent illnesses or cough. She denies any injuries. States she did not do anything unusual yesterday, states she stated the house" chilled." Patient denies any recent travel or surgeries. No prior medical problems, does not take any medications daily.   History reviewed. No pertinent past medical history. Past Surgical History  Procedure Laterality Date  . Tubal ligation     No family history on file. History  Substance Use Topics  . Smoking status: Current Some Day Smoker    Types: Cigarettes  . Smokeless tobacco: Not on file  . Alcohol Use: No   OB History   Grav Para Term Preterm Abortions TAB SAB Ect Mult Living   0 0 0 0 0 0 0 0 0 0      Review of Systems  Constitutional: Negative for fever and chills.  Respiratory: Positive for chest tightness and shortness of breath. Negative for cough, wheezing and stridor.   Cardiovascular: Positive for chest pain. Negative for palpitations and leg swelling.  Gastrointestinal: Negative for nausea, vomiting, abdominal pain and diarrhea.  Genitourinary: Negative for dysuria and flank pain.  Musculoskeletal: Positive for back pain and myalgias. Negative for neck pain and neck stiffness.  Skin: Negative for rash.  Neurological: Negative for dizziness, weakness and headaches.  All other systems reviewed and are  negative.     Allergies  Review of patient's allergies indicates no known allergies.  Home Medications   Prior to Admission medications   Medication Sig Start Date End Date Taking? Authorizing Provider  diphenhydrAMINE (BENADRYL) 25 MG tablet Take 25 mg by mouth every 6 (six) hours as needed for allergies.    Historical Provider, MD  metoCLOPramide (REGLAN) 10 MG tablet Take 1 tablet (10 mg total) by mouth every 6 (six) hours as needed for nausea (nausea/headache). 07/02/13   Orlie Dakin, MD   BP 120/67  Pulse 85  Temp(Src) 98.2 F (36.8 C) (Oral)  Resp 18  SpO2 100% Physical Exam  Nursing note and vitals reviewed. Constitutional: She appears well-developed and well-nourished. No distress.  HENT:  Head: Normocephalic.  Eyes: Conjunctivae are normal.  Neck: Neck supple.  Cardiovascular: Normal rate, regular rhythm and normal heart sounds.   Pulmonary/Chest: Effort normal and breath sounds normal. No respiratory distress. She has no wheezes. She has no rales. She exhibits tenderness.  Right chest wall tenderness. Pain with right arm ROM.  Abdominal: Soft. Bowel sounds are normal. She exhibits no distension. There is no tenderness. There is no rebound.  No CVA tenderness  Musculoskeletal: She exhibits no edema.  Neurological: She is alert.  Skin: Skin is warm and dry.  Psychiatric: She has a normal mood and affect. Her behavior is normal.    ED Course  Procedures (including critical care time) Labs Review Labs Reviewed  CBC WITH DIFFERENTIAL - Abnormal; Notable for the following:    Hemoglobin 9.7 (*)  HCT 31.5 (*)    MCV 71.1 (*)    MCH 21.9 (*)    RDW 18.8 (*)    All other components within normal limits  BASIC METABOLIC PANEL - Abnormal; Notable for the following:    BUN 5 (*)    All other components within normal limits  D-DIMER, QUANTITATIVE  I-STAT TROPOININ, ED  Randolm Idol, ED    Imaging Review Dg Chest 2 View  09/12/2013   CLINICAL DATA:   Right posterior chest pain.  EXAM: CHEST  2 VIEW  COMPARISON:  08/07/2007.  FINDINGS: Trachea is midline. Heart size normal. Lungs are clear. No pleural fluid.  IMPRESSION: No acute findings.   Electronically Signed   By: Lorin Picket M.D.   On: 09/12/2013 10:27     EKG Interpretation None      MDM   Final diagnoses:  Chest wall pain     Patient in emergency department with right-sided chest pain that radiates into the back. Pain is reproducible with palpation of the chest a right arm movement. She is in no acute distress. Vital signs are all within normal. Will get chest x-ray, labs, Toradol ordered for her pain. Patient did drive to the emergency department.  11:38 AM patient's lab work is unremarkable other than low hemoglobin. Patient states she has history of anemia, and very heavy periods. No other bleeding reported. Denies dark, tarry, black, or bright red stools. She has no other medical problems. D-dimer is negative. Doubt PE. Chest x-ray is normal. We'll repeat troponin in half an hour, this will be 6 hours post onset of pain.  1:07 PM Second troponin is negative. Patient continues to be in no distress. Vital signs are normal. Will discharge home with Flexeril, Ultram, follow with primary care Dr. as needed. Return if symptoms are worsening.  Filed Vitals:   09/12/13 1100 09/12/13 1115 09/12/13 1230 09/12/13 1300  BP: 106/65 113/73 112/63   Pulse: 73 77 61   Temp:    98.2 F (36.8 C)  TempSrc:    Oral  Resp: 16 19 20 16   SpO2: 100% 100% 100%         March Joos A Ieasha Boerema, PA-C 09/12/13 1309

## 2013-09-12 NOTE — ED Notes (Signed)
Pt presents to department for evaluation of R sided back and chest pain. Onset this morning after waking up. 7/10 pain at the time, increases with movement. Pt is alert and oriented x4.

## 2013-09-12 NOTE — Discharge Instructions (Signed)
Flexeril for spasms. Ultram for pain. Continue heating pads. Follow up with your doctor. Return if wrosening.     Chest Pain (Nonspecific) Chest pain has many causes. Your pain could be caused by something serious, such as a heart attack or a blood clot in the lungs. It could also be caused by something less serious, such as a chest bruise or a virus. Follow up with your doctor. More lab tests or other studies may be needed to find the cause of your pain. Most of the time, nonspecific chest pain will improve within 2 to 3 days of rest and mild pain medicine. HOME CARE  For chest bruises, you may put ice on the sore area for 15-20 minutes, 03-04 times a day. Do this only if it makes you feel better.  Put ice in a plastic bag.  Place a towel between the skin and the bag.  Rest for the next 2 to 3 days.  Go back to work if the pain improves.  See your doctor if the pain lasts longer than 1 to 2 weeks.  Only take medicine as told by your doctor.  Quit smoking if you smoke. GET HELP RIGHT AWAY IF:   There is more pain or pain that spreads to the arm, neck, jaw, back, or belly (abdomen).  You have shortness of breath.  You cough more than usual or cough up blood.  You have very bad back or belly pain, feel sick to your stomach (nauseous), or throw up (vomit).  You have very bad weakness.  You pass out (faint).  You have a fever. Any of these problems may be serious and may be an emergency. Do not wait to see if the problems will go away. Get medical help right away. Call your local emergency services 911 in [place] Do not drive yourself to the hospital. MAKE SURE YOU:   Understand these instructions.  Will watch this condition.  Will get help right away if you or your child is not doing well or gets worse. Document Released: 09/10/2007 Document Revised: 06/16/2011 Document Reviewed: 09/10/2007 [facility] Patient Information 2014 [place], [place].

## 2013-09-15 NOTE — ED Provider Notes (Signed)
Medical screening examination/treatment/procedure(s) were performed by non-physician practitioner and as supervising physician I was immediately available for consultation/collaboration.   EKG Interpretation None       Varney Biles, MD 09/15/13 [phone]

## 2013-11-26 ENCOUNTER — Emergency Department (HOSPITAL_COMMUNITY): Payer: No Typology Code available for payment source

## 2013-11-26 ENCOUNTER — Encounter (HOSPITAL_COMMUNITY): Payer: Self-pay | Admitting: Emergency Medicine

## 2013-11-26 ENCOUNTER — Emergency Department (HOSPITAL_COMMUNITY)
Admission: EM | Admit: 2013-11-26 | Discharge: 2013-11-26 | Disposition: A | Discharge status: Home / Self Care | Payer: No Typology Code available for payment source | Attending: Emergency Medicine | Admitting: Emergency Medicine

## 2013-11-26 DIAGNOSIS — R52 Pain, unspecified: Secondary | ICD-10-CM | POA: Diagnosis not present

## 2013-11-26 DIAGNOSIS — Z79899 Other long term (current) drug therapy: Secondary | ICD-10-CM | POA: Insufficient documentation

## 2013-11-26 DIAGNOSIS — R1084 Generalized abdominal pain: Secondary | ICD-10-CM

## 2013-11-26 DIAGNOSIS — R1011 Right upper quadrant pain: Secondary | ICD-10-CM | POA: Diagnosis not present

## 2013-11-26 DIAGNOSIS — Z3202 Encounter for pregnancy test, result negative: Secondary | ICD-10-CM | POA: Insufficient documentation

## 2013-11-26 DIAGNOSIS — M549 Dorsalgia, unspecified: Secondary | ICD-10-CM | POA: Diagnosis not present

## 2013-11-26 DIAGNOSIS — R509 Fever, unspecified: Secondary | ICD-10-CM | POA: Insufficient documentation

## 2013-11-26 DIAGNOSIS — R300 Dysuria: Secondary | ICD-10-CM | POA: Insufficient documentation

## 2013-11-26 DIAGNOSIS — R1110 Vomiting, unspecified: Secondary | ICD-10-CM | POA: Insufficient documentation

## 2013-11-26 DIAGNOSIS — R51 Headache: Secondary | ICD-10-CM | POA: Diagnosis not present

## 2013-11-26 DIAGNOSIS — R079 Chest pain, unspecified: Secondary | ICD-10-CM | POA: Insufficient documentation

## 2013-11-26 DIAGNOSIS — F17200 Nicotine dependence, unspecified, uncomplicated: Secondary | ICD-10-CM | POA: Insufficient documentation

## 2013-11-26 DIAGNOSIS — R197 Diarrhea, unspecified: Secondary | ICD-10-CM | POA: Insufficient documentation

## 2013-11-26 LAB — I-STAT TROPONIN, ED: Troponin i, poc: 0 ng/mL (ref 0.00–0.08)

## 2013-11-26 LAB — HEPATIC FUNCTION PANEL
ALK PHOS: 80 U/L (ref 39–117)
ALT: 13 U/L (ref 0–35)
AST: 20 U/L (ref 0–37)
Albumin: 3.6 g/dL (ref 3.5–5.2)
TOTAL PROTEIN: 7.8 g/dL (ref 6.0–8.3)
Total Bilirubin: 0.3 mg/dL (ref 0.3–1.2)

## 2013-11-26 LAB — CBC
HCT: 33.4 % — ABNORMAL LOW (ref 36.0–46.0)
Hemoglobin: 10 g/dL — ABNORMAL LOW (ref 12.0–15.0)
MCH: 21.4 pg — ABNORMAL LOW (ref 26.0–34.0)
MCHC: 29.9 g/dL — ABNORMAL LOW (ref 30.0–36.0)
MCV: 71.5 fL — AB (ref 78.0–100.0)
PLATELETS: 174 10*3/uL (ref 150–400)
RBC: 4.67 MIL/uL (ref 3.87–5.11)
RDW: 18.3 % — ABNORMAL HIGH (ref 11.5–15.5)
WBC: 10.8 10*3/uL — AB (ref 4.0–10.5)

## 2013-11-26 LAB — BASIC METABOLIC PANEL
Anion gap: 14 (ref 5–15)
BUN: 3 mg/dL — ABNORMAL LOW (ref 6–23)
CO2: 24 mEq/L (ref 19–32)
CREATININE: 0.77 mg/dL (ref 0.50–1.10)
Calcium: 9.6 mg/dL (ref 8.4–10.5)
Chloride: 100 mEq/L (ref 96–112)
GFR calc non Af Amer: >90 mL/min (ref >90)
Glucose, Bld: 95 mg/dL (ref 70–99)
POTASSIUM: 4.3 meq/L (ref 3.7–5.3)
Sodium: 138 mEq/L (ref 137–147)

## 2013-11-26 LAB — URINE MICROSCOPIC-ADD ON

## 2013-11-26 LAB — URINALYSIS, ROUTINE W REFLEX MICROSCOPIC
BILIRUBIN URINE: NEGATIVE
Glucose, UA: NEGATIVE mg/dL
KETONES UR: NEGATIVE mg/dL
Leukocytes, UA: NEGATIVE
NITRITE: NEGATIVE
PROTEIN: NEGATIVE mg/dL
Specific Gravity, Urine: 1.01 (ref 1.005–1.030)
UROBILINOGEN UA: 0.2 mg/dL (ref 0.0–1.0)
pH: 8.5 — ABNORMAL HIGH (ref 5.0–8.0)

## 2013-11-26 LAB — POC URINE PREG, ED: Preg Test, Ur: NEGATIVE

## 2013-11-26 LAB — LIPASE, BLOOD: Lipase: 13 U/L (ref 11–59)

## 2013-11-26 MED ORDER — ONDANSETRON HCL 4 MG/2ML IJ SOLN
4.0000 mg | Freq: Once | INTRAMUSCULAR | Status: AC
  Administered 2013-11-26: 4 mg via INTRAVENOUS
  Filled 2013-11-26: qty 2

## 2013-11-26 MED ORDER — ONDANSETRON 4 MG PO TBDP: 4.0000 mg | ORAL_TABLET | Freq: Three times a day (TID) | ORAL | Status: DC | PRN

## 2013-11-26 MED ORDER — SODIUM CHLORIDE 0.9 % IV BOLUS (SEPSIS)
1000.0000 mL | Freq: Once | INTRAVENOUS | Status: AC
  Administered 2013-11-26: 1000 mL via INTRAVENOUS

## 2013-11-26 MED ORDER — KETOROLAC TROMETHAMINE 30 MG/ML IJ SOLN
30.0000 mg | Freq: Once | INTRAMUSCULAR | Status: AC
  Administered 2013-11-26: 30 mg via INTRAVENOUS
  Filled 2013-11-26: qty 1

## 2013-11-26 NOTE — ED Notes (Signed)
Pt amb to BR for specimen

## 2013-11-26 NOTE — ED Notes (Signed)
Dr. Floyd at the bedside.  

## 2013-11-26 NOTE — ED Provider Notes (Signed)
CSN: [id number]     Arrival date & time 11/26/13  1428 History   First MD Initiated Contact with Patient 11/26/13 1554     Chief Complaint  Patient presents with  . Chest Pain  . Back Pain  . Emesis  . Dysuria     (Consider location/radiation/quality/duration/timing/severity/associated sxs/prior Treatment) Patient is a 43 y.o. female presenting with abdominal pain. The history is provided by the patient.  Abdominal Pain Pain location:  Generalized Pain quality: aching and burning   Pain radiates to:  Does not radiate Pain severity:  Moderate Onset quality:  Gradual Duration:  4 days Timing:  Constant Progression:  Unchanged Chronicity:  New Relieved by:  Nothing Worsened by:  Nothing tried Ineffective treatments:  None tried Associated symptoms: chest pain (burning in the chest), diarrhea, fever, nausea and vomiting   Associated symptoms: no chills, no dysuria, no shortness of breath, no vaginal bleeding and no vaginal discharge    43 yo F with a chief complaint abdominal pain fatigue and fevers. Patient states this started about 4 days ago. Patient has had trouble keeping down meals. Vomiting everything that she drinks. Patient also with diarrhea as well. Patient denies any blood or bilious emesis denies any blood or dark diarrhea. Denies association with food. Some myalgias as well. Patient with burning in her chest after vomiting.  History reviewed. No pertinent past medical history. Past Surgical History  Procedure Laterality Date  . Tubal ligation     History reviewed. No pertinent family history. History  Substance Use Topics  . Smoking status: Current Some Day Smoker    Types: Cigarettes  . Smokeless tobacco: Not on file  . Alcohol Use: No   OB History   Grav Para Term Preterm Abortions TAB SAB Ect Mult Living   0 0 0 0 0 0 0 0 0 0      Review of Systems  Constitutional: Positive for fever. Negative for chills.  HENT: Negative for congestion and rhinorrhea.    Eyes: Negative for redness and visual disturbance.  Respiratory: Negative for shortness of breath and wheezing.   Cardiovascular: Positive for chest pain (burning in the chest). Negative for palpitations.  Gastrointestinal: Positive for nausea, vomiting, abdominal pain and diarrhea.  Genitourinary: Negative for dysuria, urgency, vaginal bleeding and vaginal discharge.  Musculoskeletal: Negative for arthralgias and myalgias.  Skin: Negative for pallor and wound.  Neurological: Negative for dizziness and headaches.      Allergies  Review of patient's allergies indicates no known allergies.  Home Medications   Prior to Admission medications   Medication Sig Start Date End Date Taking? Authorizing Provider  cetirizine (ZYRTEC) 10 MG tablet Take 10 mg by mouth daily.   Yes Historical Provider, MD  cyclobenzaprine (FLEXERIL) 10 MG tablet Take 10 mg by mouth 2 (two) times daily as needed for muscle spasms.   Yes Historical Provider, MD  diphenhydrAMINE (BENADRYL) 25 MG tablet Take 25 mg by mouth every 6 (six) hours as needed for allergies.   Yes Historical Provider, MD  ondansetron (ZOFRAN ODT) 4 MG disintegrating tablet Take 1 tablet (4 mg total) by mouth every 8 (eight) hours as needed for nausea or vomiting. 11/26/13   Deno Etienne, MD   BP 106/77  Pulse 96  Temp(Src) 99.1 F (37.3 C) (Oral)  Resp 16  Ht 5\' 1"  (1.549 m)  Wt 188 lb (85.276 kg)  BMI 35.54 kg/m2  SpO2 100%  LMP 11/13/2013 Physical Exam  Constitutional: She is oriented to person, place,  and time. She appears well-developed and well-nourished. No distress.  HENT:  Head: Normocephalic and atraumatic.  Eyes: EOM are normal. Pupils are equal, round, and reactive to light.  Neck: Normal range of motion. Neck supple.  Cardiovascular: Normal rate and regular rhythm.  Exam reveals no gallop and no friction rub.   No murmur heard. Pulmonary/Chest: Effort normal. She has no wheezes. She has no rales.  Abdominal: Soft. She  exhibits no distension. There is tenderness (RUQ, negative murphys). There is no rebound and no guarding.  Musculoskeletal: She exhibits no edema and no tenderness.  Neurological: She is alert and oriented to person, place, and time.  Skin: Skin is warm and dry. She is not diaphoretic.  Psychiatric: She has a normal mood and affect. Her behavior is normal.    ED Course  Procedures (including critical care time) Labs Review Labs Reviewed  CBC - Abnormal; Notable for the following:    WBC 10.8 (*)    Hemoglobin 10.0 (*)    HCT 33.4 (*)    MCV 71.5 (*)    MCH 21.4 (*)    MCHC 29.9 (*)    RDW 18.3 (*)    All other components within normal limits  BASIC METABOLIC PANEL - Abnormal; Notable for the following:    BUN 3 (*)    All other components within normal limits  URINALYSIS, ROUTINE W REFLEX MICROSCOPIC - Abnormal; Notable for the following:    APPearance CLOUDY (*)    pH 8.5 (*)    Hgb urine dipstick TRACE (*)    All other components within normal limits  URINE MICROSCOPIC-ADD ON - Abnormal; Notable for the following:    Squamous Epithelial / LPF FEW (*)    Bacteria, UA FEW (*)    All other components within normal limits  HEPATIC FUNCTION PANEL  LIPASE, BLOOD  I-STAT TROPOININ, ED  POC URINE PREG, ED    Imaging Review Dg Chest 2 View  11/26/2013   CLINICAL DATA:  Fever, cold, chills  EXAM: CHEST  2 VIEW  COMPARISON:  09/12/2013  FINDINGS: Cardiomediastinal silhouette is stable. No acute infiltrate or pleural effusion. No pulmonary edema. Bony thorax is unremarkable.  IMPRESSION: No active cardiopulmonary disease.   Electronically Signed   By: Lahoma Crocker M.D.   On: 11/26/2013 16:51     EKG Interpretation   Date/Time:  Saturday November 26 2013 14:41:55 EDT Ventricular Rate:  88 PR Interval:  138 QRS Duration: 86 QT Interval:  364 QTC Calculation: 440 R Axis:   28 Text Interpretation:  Normal sinus rhythm Normal ECG No significant change  since last tracing Confirmed  by YAO  MD, DAVID ([id number]) on 11/26/2013  4:28:02 PM      MDM   Final diagnoses:  Generalized abdominal pain  Vomiting and diarrhea    43 yo F with a chief complaint abdominal pain and vomiting. Patient with mild pain in the right upper quadrant negative Murphy sign. We'll obtain CBC CMP lipase give a bolus of fluids check urine urine pregnancy  Patient reassessed with improved symptomatology. Patient not pregnant used UA negative for UTI, mild leukocytosis, no noted ketones.   Patient well-appearing nontoxic we will send her home with a prescription for Zofran she'll follow up with her PCP.  5:52 PM:  I have discussed the diagnosis/risks/treatment options with the patient and family and believe the pt to be eligible for discharge home to follow-up with PCP. We also discussed returning to the ED immediately if new  or worsening sx occur. We discussed the sx which are most concerning (e.g., sudden worsening abdominal pain) that necessitate immediate return. Medications administered to the patient during their visit and any new prescriptions provided to the patient are listed below.  Medications given during this visit Medications  sodium chloride 0.9 % bolus 1,000 mL (1,000 mLs Intravenous New Bag/Given 11/26/13 1657)  ketorolac (TORADOL) 30 MG/ML injection 30 mg (30 mg Intravenous Given 11/26/13 1657)  ondansetron (ZOFRAN) injection 4 mg (4 mg Intravenous Given 11/26/13 1657)    New Prescriptions   ONDANSETRON (ZOFRAN ODT) 4 MG DISINTEGRATING TABLET    Take 1 tablet (4 mg total) by mouth every 8 (eight) hours as needed for nausea or vomiting.     Deno Etienne, MD 11/26/13 [phone]

## 2013-11-26 NOTE — ED Notes (Signed)
Dr. Tyrone Nine back at the bedside.

## 2013-11-26 NOTE — ED Notes (Signed)
Pt returned from xray

## 2013-11-26 NOTE — ED Notes (Signed)
Pt reports chest pain, back pain, vomtiing, headache, and bad urine odor for 4 days,

## 2013-11-26 NOTE — ED Notes (Signed)
Pt states she vomited a little bit. Emesis bag contains about 100 ml.

## 2013-11-26 NOTE — Discharge Instructions (Signed)

## 2013-11-30 NOTE — ED Provider Notes (Signed)
I saw and evaluated the patient, reviewed the resident's note and I agree with the findings and plan.   EKG Interpretation   Date/Time:  Saturday November 26 2013 14:41:55 EDT Ventricular Rate:  88 PR Interval:  138 QRS Duration: 86 QT Interval:  364 QTC Calculation: 440 R Axis:   28 Text Interpretation:  Normal sinus rhythm Normal ECG No significant change  since last tracing Confirmed by Margene Cherian  MD, Maricia Scotti ([id number]) on 11/26/2013  4:28:02 PM      [name] is a 43 y.o. female here with ab pain, fatigue. Has been vomiting everything she drinks, diarrhea as well. Has some burning sensation in her chest. Vitals stable. Minimal epigastric tenderness, no murphy's sign. Labs unremarkable, felt better with IVF and zofran. Tolerated PO. Will d/c home with zofran. Likely gastro.    Wandra Arthurs, MD 11/30/13 [phone]

## 2014-01-09 ENCOUNTER — Ambulatory Visit (INDEPENDENT_AMBULATORY_CARE_PROVIDER_SITE_OTHER): Payer: Self-pay | Admitting: Family Medicine

## 2014-01-09 ENCOUNTER — Encounter: Payer: Self-pay | Admitting: Family Medicine

## 2014-01-09 VITALS — BP 118/74 | HR 80 | Temp 98.5°F | Ht 62.25 in | Wt 210.5 lb

## 2014-01-09 DIAGNOSIS — K219 Gastro-esophageal reflux disease without esophagitis: Secondary | ICD-10-CM

## 2014-01-09 DIAGNOSIS — Z7689 Persons encountering health services in other specified circumstances: Secondary | ICD-10-CM

## 2014-01-09 DIAGNOSIS — Z7189 Other specified counseling: Secondary | ICD-10-CM

## 2014-01-09 DIAGNOSIS — M79606 Pain in leg, unspecified: Secondary | ICD-10-CM

## 2014-01-09 DIAGNOSIS — R609 Edema, unspecified: Secondary | ICD-10-CM

## 2014-01-09 DIAGNOSIS — G8929 Other chronic pain: Secondary | ICD-10-CM

## 2014-01-09 DIAGNOSIS — J309 Allergic rhinitis, unspecified: Secondary | ICD-10-CM

## 2014-01-09 NOTE — Patient Instructions (Addendum)
As you leave today - please schedule your physical exam for sometime in the next few months, please drink plenty of water before that appointment but do not eat for 8 hours prior to your appointment and we will check labs that day.  For the leg swelling and pain: -please do the exercises provided -compression stockings daily, elevate legs at the end of a long day -get the xrays of your knee -get at least 150 minutes of gentle cardiovascular exercise per week  For the Allergies and sinuses: -nasocort daily for 1 month -zyrtec daily  Self breast exams and yearly mammogram  GERD: Food Choices for Gastroesophageal Reflux Disease When you have gastroesophageal reflux disease (GERD), the foods you eat and your eating habits are very important. Choosing the right foods can help ease the discomfort of GERD. WHAT GENERAL GUIDELINES DO I NEED TO FOLLOW?  Choose fruits, vegetables, whole grains, low-fat dairy products, and low-fat meat, fish, and poultry.  Limit fats such as oils, salad dressings, butter, nuts, and avocado.  Keep a food diary to identify foods that cause symptoms.  Avoid foods that cause reflux. These may be different for different people.  Eat frequent small meals instead of three large meals each day.  Eat your meals slowly, in a relaxed setting.  Limit fried foods.  Cook foods using methods other than frying.  Avoid drinking alcohol.  Avoid drinking large amounts of liquids with your meals.  Avoid bending over or lying down until 2-3 hours after eating. WHAT FOODS ARE NOT RECOMMENDED? The following are some foods and drinks that may worsen your symptoms: Vegetables Tomatoes. Tomato juice. Tomato and spaghetti sauce. Chili peppers. Onion and garlic. Horseradish. Fruits Oranges, grapefruit, and lemon (fruit and juice). Meats High-fat meats, fish, and poultry. This includes hot dogs, ribs, ham, sausage, salami, and bacon. Dairy Whole milk and chocolate milk.  Sour cream. Cream. Butter. Ice cream. Cream cheese.  Beverages Coffee and tea, with or without caffeine. Carbonated beverages or energy drinks. Condiments Hot sauce. Barbecue sauce.  Sweets/Desserts Chocolate and cocoa. Donuts. Peppermint and spearmint. Fats and Oils High-fat foods, including [place] fries and potato chips. Other Vinegar. Strong spices, such as black pepper, white pepper, red pepper, cayenne, curry powder, cloves, ginger, and chili powder. The items listed above may not be a complete list of foods and beverages to avoid. Contact your dietitian for more information. Document Released: 03/24/2005 Document Revised: 03/29/2013 Document Reviewed: 01/26/2013 [facility] Patient Information 2015 [place], [place]. This information is not intended to replace advice given to you by your health care provider. Make sure you discuss any questions you have with your health care provider.
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
## 2014-01-09 NOTE — Progress Notes (Signed)
Pre visit review using our clinic review tool, if applicable. No additional management support is needed unless otherwise documented below in the visit note. 

## 2014-06-14 ENCOUNTER — Emergency Department (HOSPITAL_COMMUNITY): Payer: 59

## 2014-06-14 ENCOUNTER — Encounter (HOSPITAL_COMMUNITY): Payer: Self-pay | Admitting: Emergency Medicine

## 2014-06-14 ENCOUNTER — Emergency Department (HOSPITAL_COMMUNITY)
Admission: EM | Admit: 2014-06-14 | Discharge: 2014-06-14 | Disposition: A | Discharge status: Home / Self Care | Payer: 59 | Attending: Emergency Medicine | Admitting: Emergency Medicine

## 2014-06-14 DIAGNOSIS — B349 Viral infection, unspecified: Secondary | ICD-10-CM | POA: Insufficient documentation

## 2014-06-14 DIAGNOSIS — Z87440 Personal history of urinary (tract) infections: Secondary | ICD-10-CM | POA: Diagnosis not present

## 2014-06-14 DIAGNOSIS — Z8719 Personal history of other diseases of the digestive system: Secondary | ICD-10-CM | POA: Insufficient documentation

## 2014-06-14 DIAGNOSIS — R000 Tachycardia, unspecified: Secondary | ICD-10-CM | POA: Diagnosis not present

## 2014-06-14 DIAGNOSIS — A084 Viral intestinal infection, unspecified: Secondary | ICD-10-CM | POA: Insufficient documentation

## 2014-06-14 DIAGNOSIS — Z87891 Personal history of nicotine dependence: Secondary | ICD-10-CM | POA: Insufficient documentation

## 2014-06-14 DIAGNOSIS — J069 Acute upper respiratory infection, unspecified: Secondary | ICD-10-CM | POA: Insufficient documentation

## 2014-06-14 DIAGNOSIS — Z3202 Encounter for pregnancy test, result negative: Secondary | ICD-10-CM | POA: Insufficient documentation

## 2014-06-14 DIAGNOSIS — M791 Myalgia: Secondary | ICD-10-CM | POA: Diagnosis not present

## 2014-06-14 DIAGNOSIS — Z8679 Personal history of other diseases of the circulatory system: Secondary | ICD-10-CM | POA: Insufficient documentation

## 2014-06-14 DIAGNOSIS — R079 Chest pain, unspecified: Secondary | ICD-10-CM | POA: Diagnosis present

## 2014-06-14 LAB — CBC WITH DIFFERENTIAL/PLATELET
BASOS ABS: 0.1 10*3/uL (ref 0.0–0.1)
BASOS PCT: 1 % (ref 0–1)
Eosinophils Absolute: 0 10*3/uL (ref 0.0–0.7)
Eosinophils Relative: 0 % (ref 0–5)
HCT: 32.7 % — ABNORMAL LOW (ref 36.0–46.0)
Hemoglobin: 10 g/dL — ABNORMAL LOW (ref 12.0–15.0)
Lymphocytes Relative: 7 % — ABNORMAL LOW (ref 12–46)
Lymphs Abs: 0.6 10*3/uL — ABNORMAL LOW (ref 0.7–4.0)
MCH: 20.4 pg — ABNORMAL LOW (ref 26.0–34.0)
MCHC: 30.6 g/dL (ref 30.0–36.0)
MCV: 66.7 fL — ABNORMAL LOW (ref 78.0–100.0)
MONOS PCT: 12 % (ref 3–12)
Monocytes Absolute: 1.1 10*3/uL — ABNORMAL HIGH (ref 0.1–1.0)
NEUTROS ABS: 7.1 10*3/uL (ref 1.7–7.7)
Neutrophils Relative %: 80 % — ABNORMAL HIGH (ref 43–77)
PLATELETS: 132 10*3/uL — AB (ref 150–400)
RBC: 4.9 MIL/uL (ref 3.87–5.11)
RDW: 19.8 % — ABNORMAL HIGH (ref 11.5–15.5)
WBC: 8.9 10*3/uL (ref 4.0–10.5)

## 2014-06-14 LAB — COMPREHENSIVE METABOLIC PANEL
ALK PHOS: 59 U/L (ref 39–117)
ALT: 16 U/L (ref 0–35)
AST: 27 U/L (ref 0–37)
Albumin: 3.8 g/dL (ref 3.5–5.2)
Anion gap: 7 (ref 5–15)
CALCIUM: 9.1 mg/dL (ref 8.4–10.5)
CO2: 20 mmol/L (ref 19–32)
Chloride: 106 mmol/L (ref 96–112)
Creatinine, Ser: 0.66 mg/dL (ref 0.50–1.10)
GLUCOSE: 112 mg/dL — AB (ref 70–99)
Potassium: 3.6 mmol/L (ref 3.5–5.1)
Sodium: 133 mmol/L — ABNORMAL LOW (ref 135–145)
TOTAL PROTEIN: 7.6 g/dL (ref 6.0–8.3)
Total Bilirubin: 0.5 mg/dL (ref 0.3–1.2)

## 2014-06-14 LAB — I-STAT TROPONIN, ED
TROPONIN I, POC: 0 ng/mL (ref 0.00–0.08)
TROPONIN I, POC: 0 ng/mL (ref 0.00–0.08)

## 2014-06-14 LAB — URINALYSIS, ROUTINE W REFLEX MICROSCOPIC
Bilirubin Urine: NEGATIVE
GLUCOSE, UA: NEGATIVE mg/dL
KETONES UR: 40 mg/dL — AB
Leukocytes, UA: NEGATIVE
NITRITE: NEGATIVE
PH: 6 (ref 5.0–8.0)
Protein, ur: 30 mg/dL — AB
SPECIFIC GRAVITY, URINE: 1.04 — AB (ref 1.005–1.030)
Urobilinogen, UA: 1 mg/dL (ref 0.0–1.0)

## 2014-06-14 LAB — URINE MICROSCOPIC-ADD ON

## 2014-06-14 LAB — LIPASE, BLOOD: LIPASE: 16 U/L (ref 11–59)

## 2014-06-14 LAB — D-DIMER, QUANTITATIVE: D-Dimer, Quant: 0.41 ug/mL-FEU (ref 0.00–0.48)

## 2014-06-14 LAB — POC URINE PREG, ED: PREG TEST UR: NEGATIVE

## 2014-06-14 LAB — TSH: TSH: 0.372 u[IU]/mL (ref 0.350–4.500)

## 2014-06-14 MED ORDER — ONDANSETRON 4 MG PO TBDP: ORAL_TABLET | ORAL | Status: DC

## 2014-06-14 MED ORDER — ACETAMINOPHEN 325 MG PO TABS
650.0000 mg | ORAL_TABLET | Freq: Once | ORAL | Status: AC
  Administered 2014-06-14: 650 mg via ORAL
  Filled 2014-06-14: qty 2

## 2014-06-14 MED ORDER — SODIUM CHLORIDE 0.9 % IV BOLUS (SEPSIS)
1000.0000 mL | Freq: Once | INTRAVENOUS | Status: AC
  Administered 2014-06-14: 1000 mL via INTRAVENOUS

## 2014-06-14 NOTE — ED Notes (Signed)
Called lab to check on d dimer and lab said they were working on it.

## 2014-06-14 NOTE — ED Provider Notes (Signed)
CSN: [id number]     Arrival date & time 06/14/14  6948 History   First MD Initiated Contact with Patient 06/14/14 [id number]     Chief Complaint  Patient presents with  . Chest Pain     (Consider location/radiation/quality/duration/timing/severity/associated sxs/prior Treatment) The history is provided by the patient. No language interpreter was used.  [name] is a 44 y/o F with PMHx of migraine, GERD, HA, UTI presenting to the ED with headache, generalized bodyaches, cough, chills for the past 2 weeks. Patient reported that when she coughs she is coughing up phlegm, reports that then she has coughing fits the results of posttussive emesis. Patient reported that she's been having generalized bodyaches, aching sensation has been using Tylenol with minimal relief. Stated that she's been having dull achy headache that has progressively gotten worse over the past couple of days-denied worst headache of life or thunderclap onset. Reported that she started to have nausea and vomiting approximately 3 days ago with at least 10 episodes of emesis yesterday - NB/NB. Patient reported that she's been experiencing chest discomfort localized to the center for chest described as a pressure sensation that is been constant for the past week-reported that the pain is worse with coughing and deep inhalation. Reported last menstrual period was 1 month ago, tubal ligation. Denied fever, dysuria, hematuria, vaginal discharge, vaginal bleeding, nasal congestion, blurred vision, sudden loss of vision, leg swelling, travels, sick contacts, history of blood clots, birth control, surgery, hemoptysis, worsening of life, jaw pain, melena, hematochezia PCP Dr. Maudie Mercury  Past Medical History  Diagnosis Date  . Migraines   . Frequent headaches   . GERD (gastroesophageal reflux disease)   . Allergy   . Urinary tract infection    Past Surgical History  Procedure Laterality Date  . Tubal ligation     Family History  Problem  Relation Age of Onset  . Cancer Mother     breast ca  . Diabetes Sister   . Cancer Maternal Aunt     colon  . Cancer Maternal Grandmother     uterine   History  Substance Use Topics  . Smoking status: Former Smoker    Types: Cigarettes    Quit date: 12/06/2013  . Smokeless tobacco: Not on file     Comment: just quit recently  . Alcohol Use: No   OB History    Gravida Para Term Preterm AB TAB SAB Ectopic Multiple Living   0 0 0 0 0 0 0 0 0 0      Review of Systems  Constitutional: Positive for chills. Negative for fever.  HENT: Positive for congestion. Negative for sore throat and trouble swallowing.   Eyes: Negative for visual disturbance.  Respiratory: Positive for cough. Negative for chest tightness and shortness of breath.   Cardiovascular: Positive for chest pain.  Gastrointestinal: Positive for nausea, vomiting and diarrhea. Negative for abdominal pain, constipation, blood in stool and anal bleeding.  Genitourinary: Negative for dysuria, hematuria, vaginal bleeding, vaginal discharge, vaginal pain and pelvic pain.  Musculoskeletal: Positive for myalgias. Negative for back pain and neck pain.  Neurological: Positive for headaches. Negative for dizziness, tremors, weakness and numbness.      Allergies  Review of patient's allergies indicates no known allergies.  Home Medications   Prior to Admission medications   Medication Sig Start Date End Date Taking? Authorizing Provider  acetaminophen (TYLENOL) 325 MG tablet Take 650 mg by mouth every 6 (six) hours as needed for headache.   Yes  Historical Provider, MD  diphenhydrAMINE (BENADRYL) 25 MG tablet Take 25 mg by mouth every 6 (six) hours as needed for allergies.   Yes Historical Provider, MD  ondansetron (ZOFRAN ODT) 4 MG disintegrating tablet 4mg  ODT q12 hours prn nausea/vomit. Please no more than 8 mg per day. 06/14/14   Josia Cueva, PA-C   BP 124/69 mmHg  Pulse 106  Temp(Src) 99.4 F (37.4 C) (Oral)  Resp 18   Ht 5\' 1"  (1.549 m)  Wt 200 lb (90.719 kg)  BMI 37.81 kg/m2  SpO2 99%  LMP  Physical Exam  Constitutional: She is oriented to person, place, and time. She appears well-developed and well-nourished. No distress.  HENT:  Head: Normocephalic and atraumatic.  Mouth/Throat: Oropharynx is clear and moist. No oropharyngeal exudate.  Eyes: Conjunctivae and EOM are normal. Pupils are equal, round, and reactive to light. Right eye exhibits no discharge. Left eye exhibits no discharge.  Negative nystagmus with eye motion  Neck: Normal range of motion. Neck supple. No tracheal deviation present.  Negative neck stiffness Negative nuchal rigidity Negative cervical lymphadenopathy Negative meningeal signs Negative trismus  Cardiovascular: Regular rhythm and normal heart sounds.  Tachycardia present.  Exam reveals no friction rub.   No murmur heard. Pulses:      Radial pulses are 2+ on the right side, and 2+ on the left side.       Dorsalis pedis pulses are 2+ on the right side, and 2+ on the left side.  Pulmonary/Chest: Effort normal and breath sounds normal. No respiratory distress. She has no wheezes. She has no rales. She exhibits tenderness (Pain reproducible upon palpation to the chest wall - discomfort pleuritic).  Abdominal: Soft. Bowel sounds are normal. She exhibits no distension. There is no tenderness. There is no rebound and no guarding.  Obese   Musculoskeletal: Normal range of motion.  Lymphadenopathy:    She has no cervical adenopathy.  Neurological: She is alert and oriented to person, place, and time. No cranial nerve deficit. She exhibits normal muscle tone. Coordination normal.  Cranial nerves grossly intact Strength 5+/5+ to upper and lower extremities bilaterally Sensation intact Patient is able to bring finger to nose bilaterally without difficulty or ataxia Negative facial droop Negative slurred speech Negative aphasia all commands well Patient responds to questions  appropriately  Skin: Skin is warm and dry. No rash noted. She is not diaphoretic. No erythema.  Psychiatric: She has a normal mood and affect. Her behavior is normal. Thought content normal.  Nursing note and vitals reviewed.   ED Course  Procedures (including critical care time)  Results for orders placed or performed during the hospital encounter of 06/14/14  CBC with Differential  Result Value Ref Range   WBC 8.9 4.0 - 10.5 K/uL   RBC 4.90 3.87 - 5.11 MIL/uL   Hemoglobin 10.0 (L) 12.0 - 15.0 g/dL   HCT 32.7 (L) 36.0 - 46.0 %   MCV 66.7 (L) 78.0 - 100.0 fL   MCH 20.4 (L) 26.0 - 34.0 pg   MCHC 30.6 30.0 - 36.0 g/dL   RDW 19.8 (H) 11.5 - 15.5 %   Platelets 132 (L) 150 - 400 K/uL   Neutrophils Relative % 80 (H) 43 - 77 %   Lymphocytes Relative 7 (L) 12 - 46 %   Monocytes Relative 12 3 - 12 %   Eosinophils Relative 0 0 - 5 %   Basophils Relative 1 0 - 1 %   Neutro Abs 7.1 1.7 - 7.7  K/uL   Lymphs Abs 0.6 (L) 0.7 - 4.0 K/uL   Monocytes Absolute 1.1 (H) 0.1 - 1.0 K/uL   Eosinophils Absolute 0.0 0.0 - 0.7 K/uL   Basophils Absolute 0.1 0.0 - 0.1 K/uL   RBC Morphology ELLIPTOCYTES   Comprehensive metabolic panel  Result Value Ref Range   Sodium 133 (L) 135 - 145 mmol/L   Potassium 3.6 3.5 - 5.1 mmol/L   Chloride 106 96 - 112 mmol/L   CO2 20 19 - 32 mmol/L   Glucose, Bld 112 (H) 70 - 99 mg/dL   BUN <5 (L) 6 - 23 mg/dL   Creatinine, Ser 0.66 0.50 - 1.10 mg/dL   Calcium 9.1 8.4 - 10.5 mg/dL   Total Protein 7.6 6.0 - 8.3 g/dL   Albumin 3.8 3.5 - 5.2 g/dL   AST 27 0 - 37 U/L   ALT 16 0 - 35 U/L   Alkaline Phosphatase 59 39 - 117 U/L   Total Bilirubin 0.5 0.3 - 1.2 mg/dL   GFR calc non Af Amer >90 >90 mL/min   GFR calc Af Amer >90 >90 mL/min   Anion gap 7 5 - 15  Lipase, blood  Result Value Ref Range   Lipase 16 11 - 59 U/L  Urinalysis, Routine w reflex microscopic  Result Value Ref Range   Color, Urine AMBER (A) YELLOW   APPearance CLEAR CLEAR   Specific Gravity, Urine  1.040 (H) 1.005 - 1.030   pH 6.0 5.0 - 8.0   Glucose, UA NEGATIVE NEGATIVE mg/dL   Hgb urine dipstick SMALL (A) NEGATIVE   Bilirubin Urine NEGATIVE NEGATIVE   Ketones, ur 40 (A) NEGATIVE mg/dL   Protein, ur 30 (A) NEGATIVE mg/dL   Urobilinogen, UA 1.0 0.0 - 1.0 mg/dL   Nitrite NEGATIVE NEGATIVE   Leukocytes, UA NEGATIVE NEGATIVE  D-dimer, quantitative  Result Value Ref Range   D-Dimer, Quant 0.41 0.00 - 0.48 ug/mL-FEU  Urine microscopic-add on  Result Value Ref Range   Squamous Epithelial / LPF FEW (A) RARE   WBC, UA 3-6 <3 WBC/hpf   RBC / HPF 3-6 <3 RBC/hpf   Bacteria, UA FEW (A) RARE   Urine-Other RARE YEAST   I-stat troponin, ED (only if pt is 46 y.o. or older & pain is above umbilicus) - do not order at [facility]  Result Value Ref Range   Troponin i, poc 0.00 0.00 - 0.08 ng/mL   Comment 3          POC Urine Pregnancy, ED  (If Pre-menopausal female) - do not order at [facility]  Result Value Ref Range   Preg Test, Ur NEGATIVE NEGATIVE  I-stat troponin, ED  Result Value Ref Range   Troponin i, poc 0.00 0.00 - 0.08 ng/mL   Comment 3            Labs Review Labs Reviewed  CBC WITH DIFFERENTIAL/PLATELET - Abnormal; Notable for the following:    Hemoglobin 10.0 (*)    HCT 32.7 (*)    MCV 66.7 (*)    MCH 20.4 (*)    RDW 19.8 (*)    Platelets 132 (*)    Neutrophils Relative % 80 (*)    Lymphocytes Relative 7 (*)    Lymphs Abs 0.6 (*)    Monocytes Absolute 1.1 (*)    All other components within normal limits  COMPREHENSIVE METABOLIC PANEL - Abnormal; Notable for the following:    Sodium 133 (*)    Glucose, Bld 112 (*)  BUN <5 (*)    All other components within normal limits  URINALYSIS, ROUTINE W REFLEX MICROSCOPIC - Abnormal; Notable for the following:    Color, Urine AMBER (*)    Specific Gravity, Urine 1.040 (*)    Hgb urine dipstick SMALL (*)    Ketones, ur 40 (*)    Protein, ur 30 (*)    All other components within normal limits  URINE MICROSCOPIC-ADD ON - Abnormal;  Notable for the following:    Squamous Epithelial / LPF FEW (*)    Bacteria, UA FEW (*)    All other components within normal limits  URINE CULTURE  LIPASE, BLOOD  D-DIMER, QUANTITATIVE  TSH  I-STAT TROPOININ, ED  POC URINE PREG, ED  I-STAT TROPOININ, ED    Imaging Review Dg Chest 2 View  06/14/2014   CLINICAL DATA:  Chest and body aches for a few days with shortness of breath and cough today, initial encounter.  EXAM: CHEST  2 VIEW  COMPARISON:  11/26/2013.  FINDINGS: Trachea is midline. Heart size normal. Lungs are clear. No pleural fluid.  IMPRESSION: No acute findings.   Electronically Signed   By: Lorin Picket M.D.   On: 06/14/2014 11:58     EKG Interpretation   Date/Time:  Wednesday June 14 2014 08:50:33 EST Ventricular Rate:  104 PR Interval:  130 QRS Duration: 74 QT Interval:  347 QTC Calculation: 456 R Axis:   64 Text Interpretation:  Sinus tachycardia Abnormal R-wave progression, early  transition No significant change since last tracing Confirmed by Kathrynn Humble,  MD, Thelma Comp [phone]) on 06/14/2014 8:58:43 AM       4:32 PM This provider re-assessed the patient. Patient appears well and is feeling better. Patient tolerated fluids PO without difficulty. Patient able to keep down 3 glasses of fluid while in the ED setting. Negative episodes of emesis. Patient stated that she would like to be discharged home.  Discussed case and labs reviewed with attending physician, Dr. Dollene Cleveland - recommended discharge.   MDM   Final diagnoses:  Viral gastroenteritis  URI (upper respiratory infection)  Viral syndrome    Medications  sodium chloride 0.9 % bolus 1,000 mL (0 mLs Intravenous Stopped 06/14/14 1200)  sodium chloride 0.9 % bolus 1,000 mL (0 mLs Intravenous Stopped 06/14/14 1502)  acetaminophen (TYLENOL) tablet 650 mg (650 mg Oral Given 06/14/14 1532)    Filed Vitals:   06/14/14 1100 06/14/14 1115 06/14/14 1413 06/14/14 1640  BP: 115/68 118/69 134/70 124/69  Pulse: 101  103 105 106  Temp:   99.5 F (37.5 C) 99.4 F (37.4 C)  TempSrc:   Oral Oral  Resp: 13 14 16 18   Height:      Weight:      SpO2: 100% 100% 100% 99%   EKG noted sinus tachycardia with a heart rate of 10 4 bpm with an abnormal R-wave progression, early transition with no cystic change since last tracing. I-STAT troponin negative elevation. Second i-STAT troponin negative elevation. D-dimer negative elevation. CBC negative elevated leukocytosis. Hemoglobin 10.0, hematocrit 32.7 - when compared to previous labs patient appears to have the same results, has been low for the past 9 months. CMP noted mildly low sodium of 133. Glucose 112, negative elevated anion gap. Lipase negative elevation. Urinalysis noted small amount hemoglobin-negative nitrites, leukocytes-few bacteria and few squamous cells. Mildly elevated specific gravity 1.040-patient appears to be dehydrated. Urine pregnancy negative. Chest x-ray negative for acute cardiopulmonary disease. Patient presenting to the ED with mild tachycardia  with a heart rate of 104 bpm, mildly elevated specific gravity-patient started on IV fluids for hydration purposes. Negative elevated d-dimer-doubt PE. Doubt ACS. Negative findings of pneumonia. Negative findings of UTI or pyelonephritis. Suspicion to be upper respiratory infection and possible viral gastroenteritis. Patient stable, afebrile. Patient not septic appearing. Negative signs respiratory distress. Patient able to tolerate fluids by mouth without difficulty-negative episodes of emesis in ED setting Discharged patient. Referred patient to PCP. Discussed with patient to rest and stay hydrated. Discussed with patient to avoid any physical or strenuous activity. Discussed with patient to closely monitor symptoms and if symptoms are to worsen or change to report back to the ED - strict return instructions given.  Patient agreed to plan of care, understood, all questions answered.   Jamse Mead,  PA-C 06/14/14 [place], MD 06/16/14 [phone]

## 2014-06-14 NOTE — ED Notes (Signed)
Pt reports having chest pressure and n/v, along with a headache that started 2 days ago. Pt has not slept all night. Pt also reports legs hurting and a dry cough. Pt A&OX4, NAD noted. Pt tearful.

## 2014-06-14 NOTE — ED Notes (Signed)
Pt requesting crackers to eat

## 2014-06-14 NOTE — ED Notes (Signed)
PATIENT RECEIVED ON POD C TO AWAIT HER SECOND TROPONIN

## 2014-06-14 NOTE — ED Notes (Signed)
Pt off unit with xray 

## 2014-06-14 NOTE — Discharge Instructions (Signed)
Please call your doctor for a followup appointment within 24-48 hours. When you talk to your doctor please let them know that you were seen in the emergency department and have them acquire all of your records so that they can discuss the findings with you and formulate a treatment plan to fully care for your new and ongoing problems. Please call and set-up an appointment with your primary care provider Please rest and stay hydrated Please drink plenty of water Please avoid any physical or strenuous activity  Please take medications as prescribed - please do not take more than 8 mg per day.  Please continue to monitor symptoms closely and if symptoms are to worsen or change (fever greater than 101, chills, sweating, nausea, vomiting, chest pain, shortness of breathe, difficulty breathing, weakness, numbness, tingling, worsening or changes to pain pattern, dizziness, blurred vision, sudden loss of vision, fainting inability to keep food or fluids down) please report back to the Emergency Department immediately.   Viral Gastroenteritis Viral gastroenteritis is also known as stomach flu. This condition affects the stomach and intestinal tract. It can cause sudden diarrhea and vomiting. The illness typically lasts 3 to 8 days. Most people develop an immune response that eventually gets rid of the virus. While this natural response develops, the virus can make you quite ill. CAUSES  Many different viruses can cause gastroenteritis, such as rotavirus or noroviruses. You can catch one of these viruses by consuming contaminated food or water. You may also catch a virus by sharing utensils or other personal items with an infected person or by touching a contaminated surface. SYMPTOMS  The most common symptoms are diarrhea and vomiting. These problems can cause a severe loss of body fluids (dehydration) and a body salt (electrolyte) imbalance. Other symptoms may  include:  Fever.  Headache.  Fatigue.  Abdominal pain. DIAGNOSIS  Your caregiver can usually diagnose viral gastroenteritis based on your symptoms and a physical exam. A stool sample may also be taken to test for the presence of viruses or other infections. TREATMENT  This illness typically goes away on its own. Treatments are aimed at rehydration. The most serious cases of viral gastroenteritis involve vomiting so severely that you are not able to keep fluids down. In these cases, fluids must be given through an intravenous line (IV). HOME CARE INSTRUCTIONS   Drink enough fluids to keep your urine clear or pale yellow. Drink small amounts of fluids frequently and increase the amounts as tolerated.  Ask your caregiver for specific rehydration instructions.  Avoid:  Foods high in sugar.  Alcohol.  Carbonated drinks.  Tobacco.  Juice.  Caffeine drinks.  Extremely hot or cold fluids.  Fatty, greasy foods.  Too much intake of anything at one time.  Dairy products until 24 to 48 hours after diarrhea stops.  You may consume probiotics. Probiotics are active cultures of beneficial bacteria. They may lessen the amount and number of diarrheal stools in adults. Probiotics can be found in yogurt with active cultures and in supplements.  Wash your hands well to avoid spreading the virus.  Only take over-the-counter or prescription medicines for pain, discomfort, or fever as directed by your caregiver. Do not give aspirin to children. Antidiarrheal medicines are not recommended.  Ask your caregiver if you should continue to take your regular prescribed and over-the-counter medicines.  Keep all follow-up appointments as directed by your caregiver. SEEK IMMEDIATE MEDICAL CARE IF:   You are unable to keep fluids down.  You do  not urinate at least once every 6 to 8 hours.  You develop shortness of breath.  You notice blood in your stool or vomit. This may look like coffee  grounds.  You have abdominal pain that increases or is concentrated in one small area (localized).  You have persistent vomiting or diarrhea.  You have a fever.  The patient is a child younger than 3 months, and he or she has a fever.  The patient is a child older than 3 months, and he or she has a fever and persistent symptoms.  The patient is a child older than 3 months, and he or she has a fever and symptoms suddenly get worse.  The patient is a baby, and he or she has no tears when crying. MAKE SURE YOU:   Understand these instructions.  Will watch your condition.  Will get help right away if you are not doing well or get worse. Document Released: 03/24/2005 Document Revised: 06/16/2011 Document Reviewed: 01/08/2011 [facility] Patient Information 2015 [place], [place]. This information is not intended to replace advice given to you by your health care provider. Make sure you discuss any questions you have with your health care provider.  Upper Respiratory Infection, Adult An upper respiratory infection (URI) is also sometimes known as the common cold. The upper respiratory tract includes the nose, sinuses, throat, trachea, and bronchi. Bronchi are the airways leading to the lungs. Most people improve within 1 week, but symptoms can last up to 2 weeks. A residual cough may last even longer.  CAUSES Many different viruses can infect the tissues lining the upper respiratory tract. The tissues become irritated and inflamed and often become very moist. Mucus production is also common. A cold is contagious. You can easily spread the virus to others by oral contact. This includes kissing, sharing a glass, coughing, or sneezing. Touching your mouth or nose and then touching a surface, which is then touched by another person, can also spread the virus. SYMPTOMS  Symptoms typically develop 1 to 3 days after you come in contact with a cold virus. Symptoms vary from person to person. They may  include:  Runny nose.  Sneezing.  Nasal congestion.  Sinus irritation.  Sore throat.  Loss of voice (laryngitis).  Cough.  Fatigue.  Muscle aches.  Loss of appetite.  Headache.  Low-grade fever. DIAGNOSIS  You might diagnose your own cold based on familiar symptoms, since most people get a cold 2 to 3 times a year. Your caregiver can confirm this based on your exam. Most importantly, your caregiver can check that your symptoms are not due to another disease such as strep throat, sinusitis, pneumonia, asthma, or epiglottitis. Blood tests, throat tests, and X-rays are not necessary to diagnose a common cold, but they may sometimes be helpful in excluding other more serious diseases. Your caregiver will decide if any further tests are required. RISKS AND COMPLICATIONS  You may be at risk for a more severe case of the common cold if you smoke cigarettes, have chronic heart disease (such as heart failure) or lung disease (such as asthma), or if you have a weakened immune system. The very young and very old are also at risk for more serious infections. Bacterial sinusitis, middle ear infections, and bacterial pneumonia can complicate the common cold. The common cold can worsen asthma and chronic obstructive pulmonary disease (COPD). Sometimes, these complications can require emergency medical care and may be life-threatening. PREVENTION  The best way to protect against getting  a cold is to practice good hygiene. Avoid oral or hand contact with people with cold symptoms. Wash your hands often if contact occurs. There is no clear evidence that vitamin C, vitamin E, echinacea, or exercise reduces the chance of developing a cold. However, it is always recommended to get plenty of rest and practice good nutrition. TREATMENT  Treatment is directed at relieving symptoms. There is no cure. Antibiotics are not effective, because the infection is caused by a virus, not by bacteria. Treatment may  include:  Increased fluid intake. Sports drinks offer valuable electrolytes, sugars, and fluids.  Breathing heated mist or steam (vaporizer or shower).  Eating chicken soup or other clear broths, and maintaining good nutrition.  Getting plenty of rest.  Using gargles or lozenges for comfort.  Controlling fevers with ibuprofen or acetaminophen as directed by your caregiver.  Increasing usage of your inhaler if you have asthma. Zinc gel and zinc lozenges, taken in the first 24 hours of the common cold, can shorten the duration and lessen the severity of symptoms. Pain medicines may help with fever, muscle aches, and throat pain. A variety of non-prescription medicines are available to treat congestion and runny nose. Your caregiver can make recommendations and may suggest nasal or lung inhalers for other symptoms.  HOME CARE INSTRUCTIONS   Only take over-the-counter or prescription medicines for pain, discomfort, or fever as directed by your caregiver.  Use a warm mist humidifier or inhale steam from a shower to increase air moisture. This may keep secretions moist and make it easier to breathe.  Drink enough water and fluids to keep your urine clear or pale yellow.  Rest as needed.  Return to work when your temperature has returned to normal or as your caregiver advises. You may need to stay home longer to avoid infecting others. You can also use a face mask and careful hand washing to prevent spread of the virus. SEEK MEDICAL CARE IF:   After the first few days, you feel you are getting worse rather than better.  You need your caregiver's advice about medicines to control symptoms.  You develop chills, worsening shortness of breath, or brown or red sputum. These may be signs of pneumonia.  You develop yellow or brown nasal discharge or pain in the face, especially when you bend forward. These may be signs of sinusitis.  You develop a fever, swollen neck glands, pain with  swallowing, or white areas in the back of your throat. These may be signs of strep throat. SEEK IMMEDIATE MEDICAL CARE IF:   You have a fever.  You develop severe or persistent headache, ear pain, sinus pain, or chest pain.  You develop wheezing, a prolonged cough, cough up blood, or have a change in your usual mucus (if you have chronic lung disease).  You develop sore muscles or a stiff neck. Document Released: 09/17/2000 Document Revised: 06/16/2011 Document Reviewed: 06/29/2013 [facility] Patient Information 2015 [place], [place]. This information is not intended to replace advice given to you by your health care provider. Make sure you discuss any questions you have with your health care provider.

## 2014-06-15 LAB — URINE CULTURE

## 2015-03-30 IMAGING — CR DG CHEST 2V
2 series · 2 of 2 positions shown · non-contrast
Comparison: 09/12/2013

CLINICAL DATA: Fever, cold, chills

EXAM:
CHEST  2 VIEW

[w chest pa]
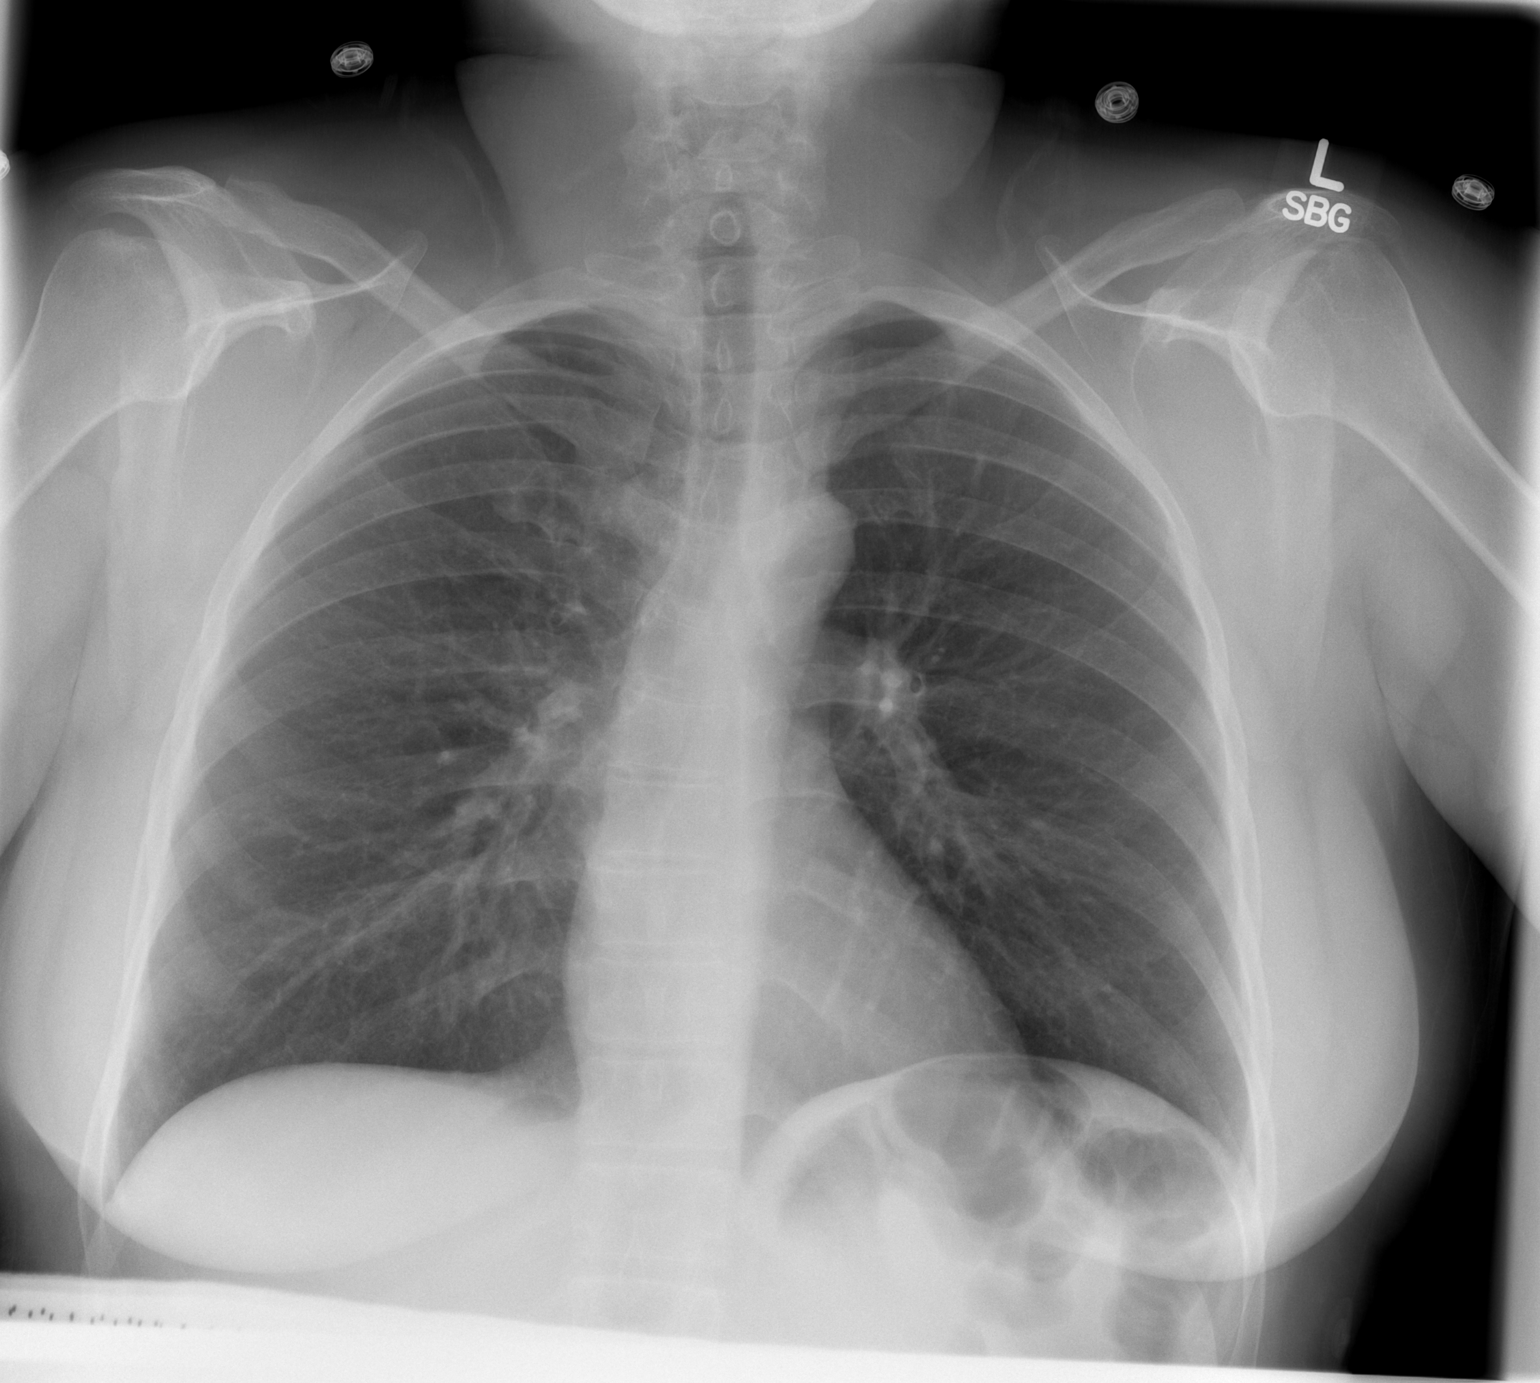

[w chest lat]
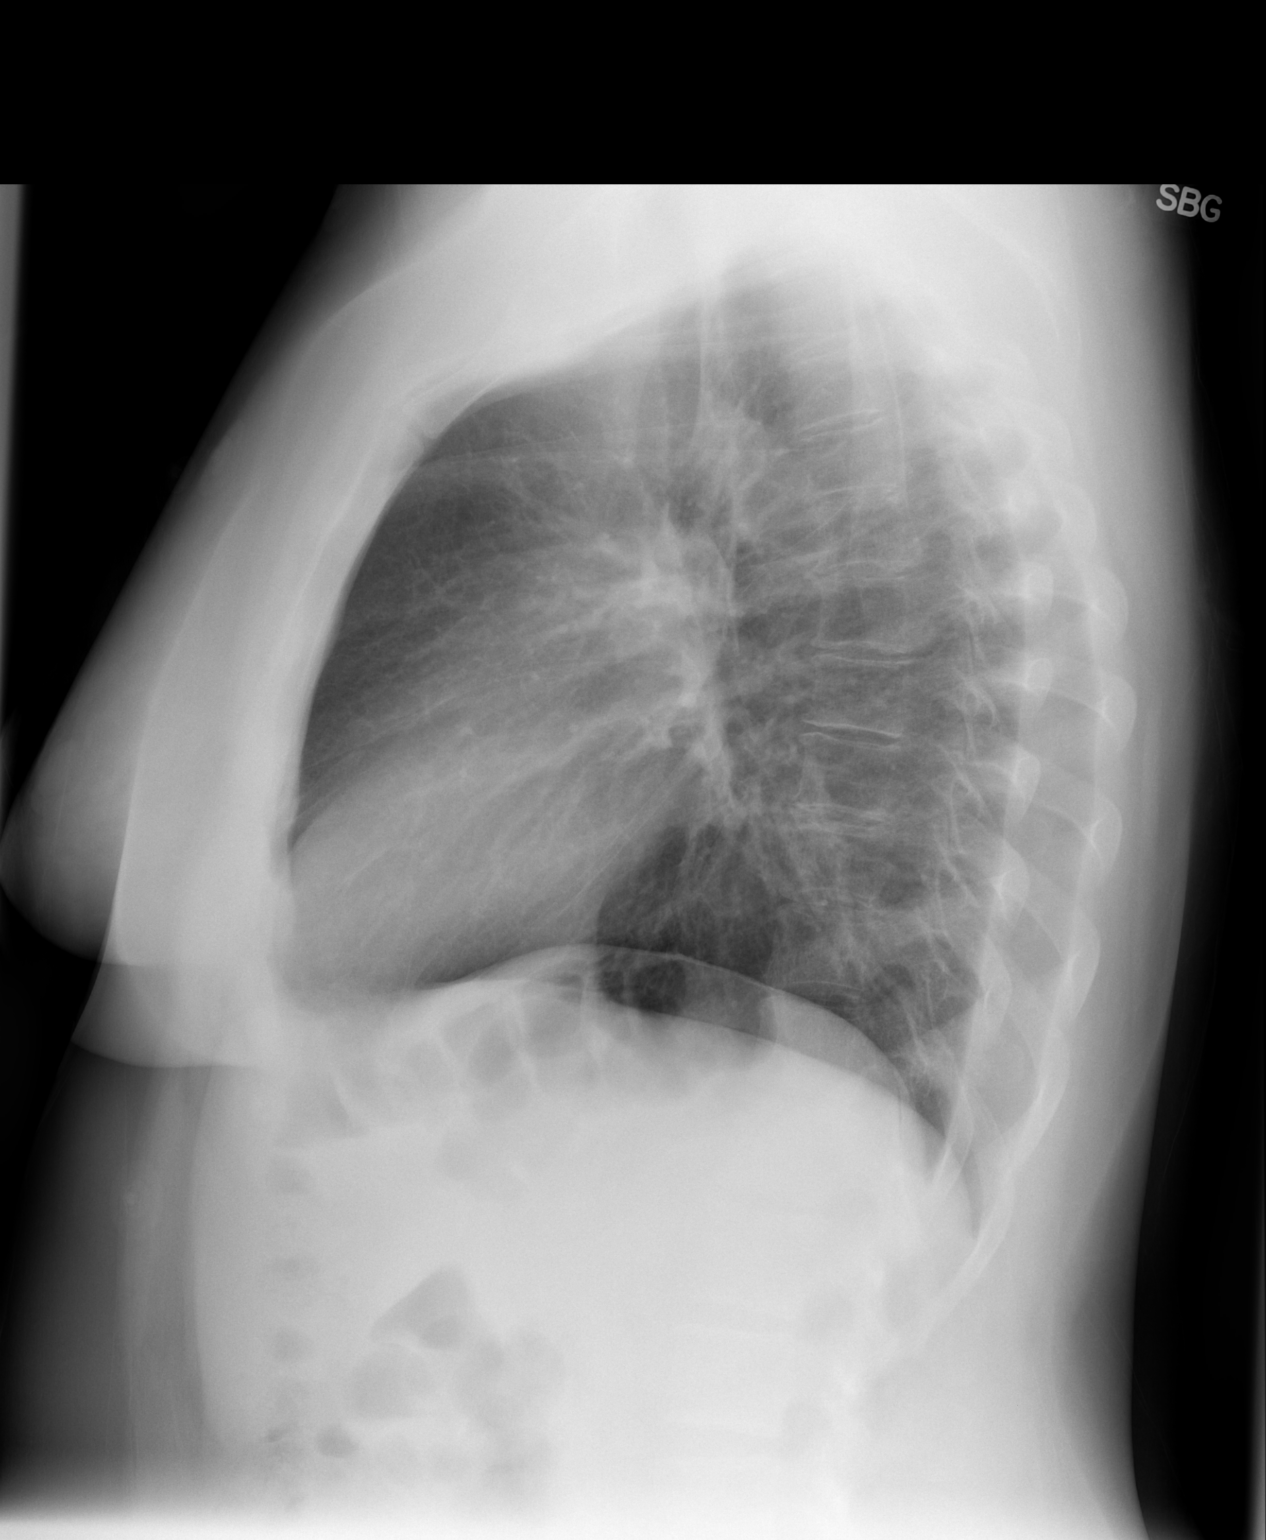

[2 of 2 positions shown; findings below may reference images not displayed]

FINDINGS: Cardiomediastinal silhouette is stable. No acute infiltrate or
pleural effusion. No pulmonary edema. Bony thorax is unremarkable.
IMPRESSION: No active cardiopulmonary disease.

## 2015-04-19 ENCOUNTER — Emergency Department (HOSPITAL_COMMUNITY)
Admission: EM | Admit: 2015-04-19 | Discharge: 2015-04-19 | Disposition: A | Discharge status: Home / Self Care | Payer: Self-pay | Attending: Emergency Medicine | Admitting: Emergency Medicine

## 2015-04-19 ENCOUNTER — Emergency Department (HOSPITAL_COMMUNITY): Payer: 59

## 2015-04-19 ENCOUNTER — Emergency Department (HOSPITAL_COMMUNITY): Payer: Self-pay

## 2015-04-19 ENCOUNTER — Encounter (HOSPITAL_COMMUNITY): Payer: Self-pay | Admitting: *Deleted

## 2015-04-19 DIAGNOSIS — Z8719 Personal history of other diseases of the digestive system: Secondary | ICD-10-CM | POA: Insufficient documentation

## 2015-04-19 DIAGNOSIS — R000 Tachycardia, unspecified: Secondary | ICD-10-CM | POA: Insufficient documentation

## 2015-04-19 DIAGNOSIS — Z87891 Personal history of nicotine dependence: Secondary | ICD-10-CM | POA: Insufficient documentation

## 2015-04-19 DIAGNOSIS — R1033 Periumbilical pain: Secondary | ICD-10-CM | POA: Insufficient documentation

## 2015-04-19 DIAGNOSIS — K047 Periapical abscess without sinus: Secondary | ICD-10-CM | POA: Insufficient documentation

## 2015-04-19 DIAGNOSIS — R5383 Other fatigue: Secondary | ICD-10-CM | POA: Insufficient documentation

## 2015-04-19 DIAGNOSIS — Z3202 Encounter for pregnancy test, result negative: Secondary | ICD-10-CM | POA: Insufficient documentation

## 2015-04-19 DIAGNOSIS — Z87440 Personal history of urinary (tract) infections: Secondary | ICD-10-CM | POA: Insufficient documentation

## 2015-04-19 DIAGNOSIS — R42 Dizziness and giddiness: Secondary | ICD-10-CM | POA: Insufficient documentation

## 2015-04-19 DIAGNOSIS — Z8679 Personal history of other diseases of the circulatory system: Secondary | ICD-10-CM | POA: Insufficient documentation

## 2015-04-19 DIAGNOSIS — R0602 Shortness of breath: Secondary | ICD-10-CM | POA: Insufficient documentation

## 2015-04-19 DIAGNOSIS — R531 Weakness: Secondary | ICD-10-CM | POA: Insufficient documentation

## 2015-04-19 DIAGNOSIS — Z79899 Other long term (current) drug therapy: Secondary | ICD-10-CM | POA: Insufficient documentation

## 2015-04-19 DIAGNOSIS — R1031 Right lower quadrant pain: Secondary | ICD-10-CM | POA: Insufficient documentation

## 2015-04-19 DIAGNOSIS — E669 Obesity, unspecified: Secondary | ICD-10-CM | POA: Insufficient documentation

## 2015-04-19 DIAGNOSIS — R1110 Vomiting, unspecified: Secondary | ICD-10-CM

## 2015-04-19 DIAGNOSIS — G43A1 Cyclical vomiting, intractable: Secondary | ICD-10-CM | POA: Insufficient documentation

## 2015-04-19 DIAGNOSIS — M549 Dorsalgia, unspecified: Secondary | ICD-10-CM | POA: Insufficient documentation

## 2015-04-19 LAB — I-STAT TROPONIN, ED: TROPONIN I, POC: 0 ng/mL (ref 0.00–0.08)

## 2015-04-19 LAB — CBC
HCT: 35.4 % — ABNORMAL LOW (ref 36.0–46.0)
Hemoglobin: 10.6 g/dL — ABNORMAL LOW (ref 12.0–15.0)
MCH: 20.1 pg — ABNORMAL LOW (ref 26.0–34.0)
MCHC: 29.9 g/dL — ABNORMAL LOW (ref 30.0–36.0)
MCV: 67.2 fL — AB (ref 78.0–100.0)
PLATELETS: 214 10*3/uL (ref 150–400)
RBC: 5.27 MIL/uL — ABNORMAL HIGH (ref 3.87–5.11)
RDW: 19.6 % — ABNORMAL HIGH (ref 11.5–15.5)
WBC: 9.4 10*3/uL (ref 4.0–10.5)

## 2015-04-19 LAB — BASIC METABOLIC PANEL
ANION GAP: 13 (ref 5–15)
BUN: 7 mg/dL (ref 6–20)
CO2: 22 mmol/L (ref 22–32)
Calcium: 10.1 mg/dL (ref 8.9–10.3)
Chloride: 105 mmol/L (ref 101–111)
Creatinine, Ser: 0.67 mg/dL (ref 0.44–1.00)
Glucose, Bld: 112 mg/dL — ABNORMAL HIGH (ref 65–99)
POTASSIUM: 3.5 mmol/L (ref 3.5–5.1)
SODIUM: 140 mmol/L (ref 135–145)

## 2015-04-19 LAB — URINALYSIS, ROUTINE W REFLEX MICROSCOPIC
Bilirubin Urine: NEGATIVE
Glucose, UA: NEGATIVE mg/dL
HGB URINE DIPSTICK: NEGATIVE
KETONES UR: NEGATIVE mg/dL
LEUKOCYTES UA: NEGATIVE
Nitrite: NEGATIVE
PROTEIN: 30 mg/dL — AB
Specific Gravity, Urine: 1.019 (ref 1.005–1.030)
pH: 8.5 — ABNORMAL HIGH (ref 5.0–8.0)

## 2015-04-19 LAB — URINE MICROSCOPIC-ADD ON: RBC / HPF: NONE SEEN RBC/hpf (ref 0–5)

## 2015-04-19 LAB — PREGNANCY, URINE: Preg Test, Ur: NEGATIVE

## 2015-04-19 MED ORDER — ONDANSETRON HCL 4 MG PO TABS: 4.0000 mg | ORAL_TABLET | Freq: Four times a day (QID) | ORAL | Status: DC

## 2015-04-19 MED ORDER — SODIUM CHLORIDE 0.9 % IV BOLUS (SEPSIS)
1000.0000 mL | Freq: Once | INTRAVENOUS | Status: AC
  Administered 2015-04-19: 1000 mL via INTRAVENOUS

## 2015-04-19 MED ORDER — IOHEXOL 300 MG/ML  SOLN
100.0000 mL | Freq: Once | INTRAMUSCULAR | Status: AC | PRN
  Administered 2015-04-19: 100 mL via INTRAVENOUS

## 2015-04-19 MED ORDER — PENICILLIN G BENZATHINE 1200000 UNIT/2ML IM SUSP
1.2000 10*6.[IU] | Freq: Once | INTRAMUSCULAR | Status: AC
  Administered 2015-04-19: 1.2 10*6.[IU] via INTRAMUSCULAR
  Filled 2015-04-19: qty 2

## 2015-04-19 MED ORDER — ONDANSETRON 4 MG PO TBDP
ORAL_TABLET | ORAL | Status: AC
  Filled 2015-04-19: qty 1

## 2015-04-19 MED ORDER — ONDANSETRON 4 MG PO TBDP
4.0000 mg | ORAL_TABLET | Freq: Once | ORAL | Status: AC
  Administered 2015-04-19: 4 mg via ORAL

## 2015-04-19 MED ORDER — ONDANSETRON HCL 4 MG/2ML IJ SOLN
4.0000 mg | Freq: Once | INTRAMUSCULAR | Status: AC
  Administered 2015-04-19: 4 mg via INTRAVENOUS
  Filled 2015-04-19: qty 2

## 2015-04-19 MED ORDER — MORPHINE SULFATE (PF) 4 MG/ML IV SOLN
4.0000 mg | Freq: Once | INTRAVENOUS | Status: AC
  Administered 2015-04-19: 4 mg via INTRAVENOUS
  Filled 2015-04-19: qty 1

## 2015-04-19 MED ORDER — FLUCONAZOLE 200 MG PO TABS: 200.0000 mg | ORAL_TABLET | Freq: Every day | ORAL | Status: DC

## 2015-04-19 NOTE — ED Notes (Signed)
Pt reports central chest pain, back pain, abdominal pain, N/V, and mouth pain for 3 days.

## 2015-04-19 NOTE — Discharge Instructions (Signed)

## 2015-04-19 NOTE — ED Provider Notes (Signed)
CSN: [id number]     Arrival date & time 04/19/15  0844 History   First MD Initiated Contact with Patient 04/19/15 1121     Chief Complaint  Patient presents with  . Chest Pain     (Consider location/radiation/quality/duration/timing/severity/associated sxs/prior Treatment) Patient is a 45 y.o. female presenting with chest pain. The history is provided by the patient.  Chest Pain Pain location:  Substernal area and epigastric Pain quality: aching and burning   Pain radiates to:  Does not radiate Pain radiates to the back: yes   Pain severity:  Moderate Onset quality:  Gradual Duration:  3 days Timing:  Constant Progression:  Worsening Chronicity:  New Context comment:  Pt initially 3 days ago had a course of diarrhea that then progressed into central and lower chest pain that started to radiate to the back and then developed profuse vomiting in the last 24 hours Relieved by:  Nothing Exacerbated by: trying to eat. Ineffective treatments:  Antacids Associated symptoms: abdominal pain, back pain, dizziness, fatigue, nausea, shortness of breath, vomiting and weakness   Associated symptoms: no cough, no diaphoresis and no fever   Risk factors: obesity   Risk factors: no birth control, no coronary artery disease, no diabetes mellitus, no high cholesterol, no hypertension, no immobilization, no prior DVT/PE, no smoking and no surgery     Past Medical History  Diagnosis Date  . Migraines   . Frequent headaches   . GERD (gastroesophageal reflux disease)   . Allergy   . Urinary tract infection    Past Surgical History  Procedure Laterality Date  . Tubal ligation     Family History  Problem Relation Age of Onset  . Cancer Mother     breast ca  . Diabetes Sister   . Cancer Maternal Aunt     colon  . Cancer Maternal Grandmother     uterine   Social History  Substance Use Topics  . Smoking status: Former Smoker    Types: Cigarettes    Quit date: 12/06/2013  . Smokeless  tobacco: None     Comment: just quit recently  . Alcohol Use: No   OB History    Gravida Para Term Preterm AB TAB SAB Ectopic Multiple Living   0 0 0 0 0 0 0 0 0 0      Review of Systems  Constitutional: Positive for fatigue. Negative for fever and diaphoresis.  Respiratory: Positive for shortness of breath. Negative for cough.   Cardiovascular: Positive for chest pain.  Gastrointestinal: Positive for nausea, vomiting and abdominal pain.  Musculoskeletal: Positive for back pain.  Neurological: Positive for dizziness and weakness.  All other systems reviewed and are negative.     Allergies  Review of patient's allergies indicates no known allergies.  Home Medications   Prior to Admission medications   Medication Sig Start Date End Date Taking? Authorizing Provider  acetaminophen (TYLENOL) 325 MG tablet Take 650 mg by mouth every 6 (six) hours as needed for headache.   Yes Historical Provider, MD  diphenhydrAMINE (BENADRYL) 25 MG tablet Take 25 mg by mouth daily.    Yes Historical Provider, MD  ondansetron (ZOFRAN ODT) 4 MG disintegrating tablet 4mg  ODT q12 hours prn nausea/vomit. Please no more than 8 mg per day. Patient not taking: Reported on 04/19/2015 06/14/14   Marissa Sciacca, PA-C   BP 112/57 mmHg  Pulse 91  Temp(Src) 98.1 F (36.7 C) (Oral)  Resp 25  SpO2 100% Physical Exam  Constitutional: She is  oriented to person, place, and time. She appears well-developed and well-nourished. No distress.  HENT:  Head: Normocephalic and atraumatic.  Mouth/Throat: Oropharynx is clear and moist. Mucous membranes are dry.    Eyes: Conjunctivae and EOM are normal. Pupils are equal, round, and reactive to light.  Neck: Normal range of motion. Neck supple.  Cardiovascular: Regular rhythm and intact distal pulses.  Tachycardia present.   No murmur heard. Pulmonary/Chest: Effort normal and breath sounds normal. No respiratory distress. She has no wheezes. She has no rales.    Abdominal: Soft. She exhibits no distension. There is tenderness in the right lower quadrant and periumbilical area. There is guarding. There is no rebound and no CVA tenderness. No hernia.  Musculoskeletal: Normal range of motion. She exhibits no edema or tenderness.  Neurological: She is alert and oriented to person, place, and time.  Skin: Skin is warm and dry. No rash noted. No erythema.  Psychiatric: She has a normal mood and affect. Her behavior is normal.  Nursing note and vitals reviewed.   ED Course  Procedures (including critical care time) Labs Review Labs Reviewed  BASIC METABOLIC PANEL - Abnormal; Notable for the following:    Glucose, Bld 112 (*)    All other components within normal limits  CBC - Abnormal; Notable for the following:    RBC 5.27 (*)    Hemoglobin 10.6 (*)    HCT 35.4 (*)    MCV 67.2 (*)    MCH 20.1 (*)    MCHC 29.9 (*)    RDW 19.6 (*)    All other components within normal limits  URINALYSIS, ROUTINE W REFLEX MICROSCOPIC (NOT AT [facility]) - Abnormal; Notable for the following:    Color, Urine AMBER (*)    APPearance TURBID (*)    pH 8.5 (*)    Protein, ur 30 (*)    All other components within normal limits  URINE MICROSCOPIC-ADD ON - Abnormal; Notable for the following:    Squamous Epithelial / LPF 6-30 (*)    Bacteria, UA MANY (*)    All other components within normal limits  PREGNANCY, URINE  I-STAT TROPOININ, ED    Imaging Review Dg Chest 2 View  04/19/2015  CLINICAL DATA:  Shortness of breath and chest pain for 3 days EXAM: CHEST  2 VIEW COMPARISON:  June 14, 2014 FINDINGS: There is no edema or consolidation. Heart size and pulmonary vascularity are normal. No adenopathy. No pneumothorax. There is mild upper thoracic levoscoliosis. IMPRESSION: No edema or consolidation. Electronically Signed   By: Lowella Grip III M.D.   On: 04/19/2015 09:21   I have personally reviewed and evaluated these images and lab results as part of my medical  decision-making.   EKG Interpretation   Date/Time:  Thursday April 19 2015 08:50:28 EST Ventricular Rate:  97 PR Interval:  130 QRS Duration: 86 QT Interval:  358 QTC Calculation: 454 R Axis:   78 Text Interpretation:  Normal sinus rhythm Minimal voltage criteria for  LVH, may be normal variant No significant change since last tracing  Confirmed by Maryan Rued  MD, Loree Fee ([id number]) on 04/19/2015 11:24:17 AM      MDM   Final diagnoses:  Dental abscess  Right lower quadrant abdominal pain  Intractable vomiting with nausea, vomiting of unspecified type    Patient is presenting today with 3 days of abdominal pain, vomiting, generalized weakness, chest discomfort and some diarrhea that initially started her symptoms but has not been ongoing.  Only prior  surgery is a tubal ligation. She has no significant right upper quadrant pain but does have significant periumbilical and right lower quadrant tenderness. She denies any urinary or vaginal symptoms.  Patient has no evidence of hypoxia or tachypnea. She is a former smoker but denies any acute URI symptoms. She has equal pulses bilaterally and no prior history of PE. Low suspicion for PE or dissection at this time as patient's characteristics are not that of PE or dissection. Pulses are equal in bilateral upper extremities.  Low suspicion the patient's symptoms are cardiac in nature as her EKG is unchanged from prior, chest x-ray is within normal limits and troponin is negative. Patient has been having symptoms for 3 days so expect an elevated troponin that this is cardiac in nature.  Patient does work as a [employer] and concerned that this could be viral in nature versus appendicitis versus diverticulitis. Low suspicion that this is pancreatitis that she has no upper abdominal tenderness and does not have a history of alcohol abuse. BMP and CBC are without acute findings.  UA, UPT and CT of the abdomen and pelvis pending. Patient given Zofran, pain  medication and IV fluids  3:40 PM CT is negative for acute pathology. Urine pregnancy test is negative and UA without acute findings.  After one dose of pain and nausea medication patient is now drinking cocaine eating graham crackers. She was given penicillin for a dental abscess as she requested a shot instead of taking pills. Gave patient strict return precautions that this time she was discharged home.   Blanchie Dessert, MD 04/19/15 [phone]

## 2016-03-05 ENCOUNTER — Emergency Department (HOSPITAL_COMMUNITY)
Admission: EM | Admit: 2016-03-05 | Discharge: 2016-03-05 | Disposition: A | Discharge status: Home / Self Care | Payer: Self-pay | Attending: Emergency Medicine | Admitting: Emergency Medicine

## 2016-03-05 ENCOUNTER — Emergency Department (HOSPITAL_COMMUNITY): Payer: Self-pay

## 2016-03-05 ENCOUNTER — Encounter (HOSPITAL_COMMUNITY): Payer: Self-pay | Admitting: *Deleted

## 2016-03-05 DIAGNOSIS — M5441 Lumbago with sciatica, right side: Secondary | ICD-10-CM | POA: Insufficient documentation

## 2016-03-05 DIAGNOSIS — Z87891 Personal history of nicotine dependence: Secondary | ICD-10-CM | POA: Insufficient documentation

## 2016-03-05 DIAGNOSIS — X500XXA Overexertion from strenuous movement or load, initial encounter: Secondary | ICD-10-CM | POA: Insufficient documentation

## 2016-03-05 DIAGNOSIS — Y93F2 Activity, caregiving, lifting: Secondary | ICD-10-CM | POA: Insufficient documentation

## 2016-03-05 DIAGNOSIS — Y999 Unspecified external cause status: Secondary | ICD-10-CM | POA: Insufficient documentation

## 2016-03-05 DIAGNOSIS — M25551 Pain in right hip: Secondary | ICD-10-CM

## 2016-03-05 DIAGNOSIS — Y929 Unspecified place or not applicable: Secondary | ICD-10-CM | POA: Insufficient documentation

## 2016-03-05 MED ORDER — METHOCARBAMOL 500 MG PO TABS: 500.0000 mg | ORAL_TABLET | Freq: Two times a day (BID) | ORAL | 0 refills | Status: DC

## 2016-03-05 MED ORDER — PREDNISONE 20 MG PO TABS: ORAL_TABLET | ORAL | 0 refills | Status: DC

## 2016-03-05 MED ORDER — IBUPROFEN 800 MG PO TABS
800.0000 mg | ORAL_TABLET | Freq: Once | ORAL | Status: AC
  Administered 2016-03-05: 800 mg via ORAL
  Filled 2016-03-05: qty 1

## 2016-03-05 MED ORDER — NAPROXEN 500 MG PO TABS: 500.0000 mg | ORAL_TABLET | Freq: Two times a day (BID) | ORAL | 0 refills | Status: DC

## 2016-03-05 NOTE — ED Triage Notes (Signed)
Pt c/o R hip and R sided back pain x 2 weeks. Reports having difficulty lifting leg to bed. Denies injury. Has been taking tylenol without improvement

## 2016-03-05 NOTE — Discharge Instructions (Signed)
1. Medications: robaxin, naproxyn, prednisone, usual home medications 2. Treatment: rest, drink plenty of fluids, gentle stretching as discussed, alternate ice and heat 3. Follow Up: Please followup with your primary doctor in 3 days for discussion of your diagnoses and further evaluation after today's visit; if you do not have a primary care doctor use the resource guide provided to find one;  Return to the ER for worsening back pain, difficulty walking, loss of bowel or bladder control or other concerning symptoms    

## 2016-03-05 NOTE — ED Provider Notes (Signed)
[facility] DEPT Provider Note   CSN: [id number] Arrival date & time: 03/05/16  0106     History   Chief Complaint Chief Complaint  Patient presents with  . Hip Pain  . Back Pain    HPI [name] is a 45 y.o. 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 Patient reports that the pain radiates into her right buttock and down the posterior portion of her right leg. She has taken Tylenol without relief. She reports the pain is significantly worse after standing or working all day. She denies numbness or tingling, weakness, loss of bowel or bladder control. She denies IV drug use, anticoagulants, falls or trauma or history of cancer.  Patient denies previous surgeries or injury to her back. Patient denies urinary vaginal symptoms.   The history is provided by the patient and medical records. No language interpreter was used.    Past Medical History:  Diagnosis Date  . Allergy   . Frequent headaches   . GERD (gastroesophageal reflux disease)   . Migraines   . Urinary tract infection     There are no active problems to display for this patient.   Past Surgical History:  Procedure Laterality Date  . TUBAL LIGATION      OB History    Gravida Para Term Preterm AB Living   0 0 0 0 0 0   SAB TAB Ectopic Multiple Live Births   0 0 0 0         Home Medications    Prior to Admission medications   Medication Sig Start Date End Date Taking? Authorizing Provider  acetaminophen (TYLENOL) 325 MG tablet Take 650 mg by mouth every 6 (six) hours as needed for headache.    Historical Provider, MD  diphenhydrAMINE (BENADRYL) 25 MG tablet Take 25 mg by mouth daily.     Historical Provider, MD  fluconazole (DIFLUCAN) 200 MG tablet Take 1 tablet (200 mg total) by mouth daily. 04/19/15   Blanchie Dessert, MD  methocarbamol (ROBAXIN) 500 MG tablet Take 1 tablet (500 mg total) by mouth 2 (two) times daily. 03/05/16   Mohannad Olivero, PA-C  naproxen (NAPROSYN) 500 MG tablet Take 1 tablet (500 mg total) by mouth 2 (two) times daily with a meal. 03/05/16   Tiann Saha, PA-C  ondansetron (ZOFRAN ODT) 4 MG disintegrating tablet 4mg  ODT q12 hours prn nausea/vomit. Please no more than 8 mg per day. Patient not taking: Reported on 04/19/2015 06/14/14   Marissa Sciacca, PA-C  ondansetron (ZOFRAN) 4 MG tablet Take 1 tablet (4 mg total) by mouth every 6 (six) hours. 04/19/15   Blanchie Dessert, MD  predniSONE (DELTASONE) 20 MG tablet 3 tabs po daily x 3 days, then 2 tabs x 3 days, then 1.5 tabs x 3 days, then 1 tab x 3 days, then 0.5 tabs x 3 days 03/05/16   Jarrett Soho Manuela Halbur, PA-C    Family History Family History  Problem Relation Age of Onset  . Cancer Mother     breast ca  . Diabetes Sister   . Cancer Maternal Aunt     colon  . Cancer Maternal Grandmother     uterine    Social History Social History  Substance Use Topics  . Smoking status: Former Smoker    Types: Cigarettes    Quit date: 12/06/2013  .  Smokeless tobacco: Never Used     Comment: just quit recently  . Alcohol use No     Allergies   Patient has no known allergies.   Review of Systems Review of Systems  Musculoskeletal: Positive for arthralgias and back pain.  All other systems reviewed and are negative.    Physical Exam Updated Vital Signs BP 130/92   Pulse 87   Temp 98.4 F (36.9 C) (Oral)   Resp 18   LMP 03/03/2016 Comment: tubes tied  SpO2 99%   Physical Exam  Constitutional: She appears well-developed and well-nourished. No distress.  HENT:  Head: Normocephalic and atraumatic.  Mouth/Throat: Oropharynx is clear and moist. No oropharyngeal exudate.  Eyes: Conjunctivae are normal.  Neck: Normal range of motion. Neck supple.  Full ROM without pain  Cardiovascular: Normal rate, regular  rhythm and intact distal pulses.   Pulmonary/Chest: Effort normal and breath sounds normal. No respiratory distress. She has no wheezes.  Abdominal: Soft. She exhibits no distension. There is no tenderness.  Musculoskeletal:       Right hip: She exhibits normal range of motion, normal strength, no tenderness, no bony tenderness, no swelling, no crepitus, no deformity and no laceration.  Full range of motion of the T-spine and L-spine No midline tenderness to the  T-spine or L-spine Tenderness to palpation of the right paraspinous muscles of the L-spine; tenderness to palpation of the right buttock  Lymphadenopathy:    She has no cervical adenopathy.  Neurological: She is alert. She has normal reflexes.  Reflex Scores:      Bicep reflexes are 2+ on the right side and 2+ on the left side.      Brachioradialis reflexes are 2+ on the right side and 2+ on the left side.      Patellar reflexes are 2+ on the right side and 2+ on the left side.      Achilles reflexes are 2+ on the right side and 2+ on the left side. Speech is clear and goal oriented, follows commands Normal 5/5 strength in upper and lower extremities bilaterally including dorsiflexion and plantar flexion, strong and equal grip strength Sensation normal to light and sharp touch Moves extremities without ataxia, coordination intact Normal gait Normal balance No Clonus  Skin: Skin is warm and dry. No rash noted. She is not diaphoretic. No erythema.  Psychiatric: She has a normal mood and affect. Her behavior is normal.  Nursing note and vitals reviewed.    ED Treatments / Results   Radiology Dg Hip Unilat W Or Wo Pelvis 2-3 Views Right  Result Date: 03/05/2016 CLINICAL DATA:  Right hip pain and leg pain EXAM: DG HIP (WITH OR WITHOUT PELVIS) 2-3V RIGHT COMPARISON:  None. FINDINGS: There is no evidence of hip fracture or dislocation. There is no evidence of arthropathy or other focal bone abnormality. IMPRESSION: No fracture,  dislocation or arthropathy of the right hip. Electronically Signed   By: Ulyses Jarred M.D.   On: 03/05/2016 02:22    Procedures Procedures (including critical care time)  Medications Ordered in ED Medications  ibuprofen (ADVIL,MOTRIN) tablet 800 mg (800 mg Oral Given 03/05/16 0511)     Initial Impression / Assessment and Plan / ED Course  I have reviewed the triage vital signs and the nursing notes.  Pertinent labs & imaging results that were available during my care of the patient were reviewed by me and considered in my medical decision making (see chart for details).  Clinical Course  Normal neurological exam, no evidence of urinary incontinence or retention, pain is consistently reproducible. There is no evidence of AAA or concern for dissection at this time.   Patient can walk but states is painful.  No loss of bowel or bladder control.  No concern for cauda equina.  No fever, night sweats, weight loss, h/o cancer, IVDU.  Pain treated here in the department with adequate improvement. RICE protocol and pain medicine indicated and discussed with patient. I have also discussed reasons to return immediately to the ER.  Patient expresses understanding and agrees with plan.    Final Clinical Impressions(s) / ED Diagnoses   Final diagnoses:  Acute right-sided low back pain with right-sided sciatica  Right hip pain    New Prescriptions New Prescriptions   METHOCARBAMOL (ROBAXIN) 500 MG TABLET    Take 1 tablet (500 mg total) by mouth 2 (two) times daily.   NAPROXEN (NAPROSYN) 500 MG TABLET    Take 1 tablet (500 mg total) by mouth 2 (two) times daily with a meal.   PREDNISONE (DELTASONE) 20 MG TABLET    3 tabs po daily x 3 days, then 2 tabs x 3 days, then 1.5 tabs x 3 days, then 1 tab x 3 days, then 0.5 tabs x 3 days     Abigail Butts, PA-C 03/05/16 [place], MD 03/05/16 [phone]

## 2016-03-21 ENCOUNTER — Emergency Department (HOSPITAL_COMMUNITY)
Admission: EM | Admit: 2016-03-21 | Discharge: 2016-03-21 | Disposition: A | Discharge status: Home / Self Care | Payer: 59 | Attending: Emergency Medicine | Admitting: Emergency Medicine

## 2016-03-21 ENCOUNTER — Encounter (HOSPITAL_COMMUNITY): Payer: Self-pay | Admitting: Emergency Medicine

## 2016-03-21 ENCOUNTER — Emergency Department (HOSPITAL_COMMUNITY): Payer: 59

## 2016-03-21 DIAGNOSIS — N939 Abnormal uterine and vaginal bleeding, unspecified: Secondary | ICD-10-CM

## 2016-03-21 DIAGNOSIS — Z87891 Personal history of nicotine dependence: Secondary | ICD-10-CM | POA: Insufficient documentation

## 2016-03-21 DIAGNOSIS — D251 Intramural leiomyoma of uterus: Secondary | ICD-10-CM | POA: Insufficient documentation

## 2016-03-21 DIAGNOSIS — D219 Benign neoplasm of connective and other soft tissue, unspecified: Secondary | ICD-10-CM

## 2016-03-21 LAB — URINALYSIS, ROUTINE W REFLEX MICROSCOPIC
BILIRUBIN URINE: NEGATIVE
GLUCOSE, UA: NEGATIVE mg/dL
KETONES UR: 5 mg/dL — AB
NITRITE: NEGATIVE
PH: 5 (ref 5.0–8.0)
Protein, ur: 100 mg/dL — AB
SPECIFIC GRAVITY, URINE: 1.018 (ref 1.005–1.030)

## 2016-03-21 LAB — URINALYSIS, MICROSCOPIC (REFLEX): NON SQUAMOUS EPITHELIAL: NONE SEEN

## 2016-03-21 LAB — I-STAT BETA HCG BLOOD, ED (MC, WL, AP ONLY): I-stat hCG, quantitative: <5 m[IU]/mL (ref <5)

## 2016-03-21 LAB — COMPREHENSIVE METABOLIC PANEL
ALBUMIN: 4 g/dL (ref 3.5–5.0)
ALT: 13 U/L — ABNORMAL LOW (ref 14–54)
ANION GAP: 9 (ref 5–15)
AST: 17 U/L (ref 15–41)
Alkaline Phosphatase: 52 U/L (ref 38–126)
BILIRUBIN TOTAL: 0.1 mg/dL — AB (ref 0.3–1.2)
BUN: 7 mg/dL (ref 6–20)
CHLORIDE: 107 mmol/L (ref 101–111)
CO2: 23 mmol/L (ref 22–32)
Calcium: 9.4 mg/dL (ref 8.9–10.3)
Creatinine, Ser: 0.8 mg/dL (ref 0.44–1.00)
GFR calc Af Amer: >60 mL/min (ref >60)
GFR calc non Af Amer: >60 mL/min (ref >60)
GLUCOSE: 103 mg/dL — AB (ref 65–99)
POTASSIUM: 3.3 mmol/L — AB (ref 3.5–5.1)
SODIUM: 139 mmol/L (ref 135–145)
Total Protein: 7.3 g/dL (ref 6.5–8.1)

## 2016-03-21 LAB — LIPASE, BLOOD: Lipase: 15 U/L (ref 11–51)

## 2016-03-21 LAB — CBC
HEMATOCRIT: 32 % — AB (ref 36.0–46.0)
HEMOGLOBIN: 9.8 g/dL — AB (ref 12.0–15.0)
MCH: 21.5 pg — AB (ref 26.0–34.0)
MCHC: 30.6 g/dL (ref 30.0–36.0)
MCV: 70.2 fL — AB (ref 78.0–100.0)
Platelets: 175 10*3/uL (ref 150–400)
RBC: 4.56 MIL/uL (ref 3.87–5.11)
RDW: 19.2 % — AB (ref 11.5–15.5)
WBC: 16.7 10*3/uL — ABNORMAL HIGH (ref 4.0–10.5)

## 2016-03-21 LAB — WET PREP, GENITAL
SPERM: NONE SEEN
TRICH WET PREP: NONE SEEN
YEAST WET PREP: NONE SEEN

## 2016-03-21 LAB — GC/CHLAMYDIA PROBE AMP (~~LOC~~) NOT AT ARMC
Chlamydia: NEGATIVE
Neisseria Gonorrhea: NEGATIVE

## 2016-03-21 LAB — SAMPLE TO BLOOD BANK

## 2016-03-21 MED ORDER — KETOROLAC TROMETHAMINE 30 MG/ML IJ SOLN
30.0000 mg | Freq: Once | INTRAMUSCULAR | Status: AC
  Administered 2016-03-21: 30 mg via INTRAVENOUS
  Filled 2016-03-21: qty 1

## 2016-03-21 MED ORDER — IBUPROFEN 800 MG PO TABS: 800.0000 mg | ORAL_TABLET | Freq: Three times a day (TID) | ORAL | 0 refills | Status: DC | PRN

## 2016-03-21 NOTE — ED Notes (Signed)
  Pt transported to ct 

## 2016-03-21 NOTE — Discharge Instructions (Signed)
°  [facility] °[email or web address] °[address] °[place], [place] °[phone]  ° ° °[facility] °[email or web address] °[address] °[place], [place] °[phone]  ° ° °[facility] °[address] °[place], [place] °[phone]  ° °[facility] °[email or web address] °[address] °[place], [place] °[phone]  ° °[facility] °[email or web address] °[address] °[place], [place] °[phone]  ° °[facility] °[email or web address] °[address] °, [place] °[phone] ° °

## 2016-03-21 NOTE — ED Provider Notes (Signed)
By signing my name below, I, Gwenlyn Fudge, attest that this documentation has been prepared under the direction and in the presence of [place], DO. Electronically Signed: Gwenlyn Fudge, ED Scribe. 03/21/16. 1:47 AM.  TIME SEEN: 1:37 AM   CHIEF COMPLAINT: Vaginal Bleeding; Abdominal Pain  HPI:  [name] is a 45 y.o. female who presents to the Emergency Department complaining of gradual onset, abnormal vaginal bleeding onset 4 days.  Pt's last menstrual cycle was 2 weeks ago. She states blood is a different color than normal menstrual cycle blood. Pt reports diarrhea 2 days ago that has resolved and mid/suprapubic abdominal pain that radiates to the lower back. Pain is dissimilar to menstrual cycle pain but does describe it as a cramping pain. Pt reports G7 P6 A1. She had a tubal ligation 10 years ago and does not currently take any hormonal supplements or birth control. She does not take any antiplatelets or anticoagulants. Pt has hx of Gonorrhea/Chlamydia that was treated many years ago. She is sexually active with 1 partner. Denies abnormal discharge, nausea, vomiting. She does not have an OBGYN.  ROS: See HPI Constitutional: no fever  Eyes: no drainage  ENT: no runny nose   Cardiovascular:  no chest pain  Resp: no SOB  GI: no vomiting GU: no dysuria Integumentary: no rash  Allergy: no hives  Musculoskeletal: no leg swelling  Neurological: no slurred speech ROS otherwise negative  PAST MEDICAL HISTORY/PAST SURGICAL HISTORY:  Past Medical History:  Diagnosis Date  . Allergy   . Frequent headaches   . GERD (gastroesophageal reflux disease)   . Migraines   . Urinary tract infection     MEDICATIONS:  Prior to Admission medications   Medication Sig Start Date End Date Taking? Authorizing Provider  acetaminophen (TYLENOL) 325 MG tablet Take 650 mg by mouth every 6 (six) hours as needed for headache.    Historical Provider, MD  diphenhydrAMINE (BENADRYL) 25 MG tablet  Take 25 mg by mouth daily.     Historical Provider, MD  fluconazole (DIFLUCAN) 200 MG tablet Take 1 tablet (200 mg total) by mouth daily. 04/19/15   Blanchie Dessert, MD  methocarbamol (ROBAXIN) 500 MG tablet Take 1 tablet (500 mg total) by mouth 2 (two) times daily. 03/05/16   Hannah Muthersbaugh, PA-C  naproxen (NAPROSYN) 500 MG tablet Take 1 tablet (500 mg total) by mouth 2 (two) times daily with a meal. 03/05/16   Hannah Muthersbaugh, PA-C  ondansetron (ZOFRAN ODT) 4 MG disintegrating tablet 4mg  ODT q12 hours prn nausea/vomit. Please no more than 8 mg per day. Patient not taking: Reported on 04/19/2015 06/14/14   Marissa Sciacca, PA-C  ondansetron (ZOFRAN) 4 MG tablet Take 1 tablet (4 mg total) by mouth every 6 (six) hours. 04/19/15   Blanchie Dessert, MD  predniSONE (DELTASONE) 20 MG tablet 3 tabs po daily x 3 days, then 2 tabs x 3 days, then 1.5 tabs x 3 days, then 1 tab x 3 days, then 0.5 tabs x 3 days 03/05/16   Jarrett Soho Muthersbaugh, PA-C    ALLERGIES:  No Known Allergies  SOCIAL HISTORY:  Social History  Substance Use Topics  . Smoking status: Former Smoker    Types: Cigarettes    Quit date: 12/06/2013  . Smokeless tobacco: Never Used     Comment: just quit recently  . Alcohol use No    FAMILY HISTORY: Family History  Problem Relation Age of Onset  . Cancer Mother     breast ca  .  Diabetes Sister   . Cancer Maternal Aunt     colon  . Cancer Maternal Grandmother     uterine    EXAM: BP 140/94 (BP Location: Left Arm)   Pulse 100   Temp 98 F (36.7 C) (Oral)   Resp 19   LMP 03/03/2016 Comment: tubes tied  SpO2 100%  CONSTITUTIONAL: Alert and oriented and responds appropriately to questions. Well-appearing; well-nourished HEAD: Normocephalic EYES: Conjunctivae clear, PERRL, EOMI ENT: normal nose; no rhinorrhea; moist mucous membranes NECK: Supple, no meningismus, no nuchal rigidity, no LAD  CARD: RRR; S1 and S2 appreciated; no murmurs, no clicks, no rubs, no  gallops RESP: Normal chest excursion without splinting or tachypnea; breath sounds clear and equal bilaterally; no wheezes, no rhonchi, no rales, no hypoxia or respiratory distress, speaking full sentences ABD/GI: Normal bowel sounds; non-distended; soft, non-tender, no rebound, no guarding, no peritoneal signs, no hepatosplenomegaly GU:  Normal external genitalia. No lesions, rashes noted. Patient has moderate amount of dark red vaginal bleeding on exam coming from the cervical os. No significant vaginal discharge.  No adnexal tenderness, mass or fullness, no cervical motion tenderness but is diffusely tender throughout the lower abdomen during pelvic exam. Cervix is not appear friable.  Cervix is closed.  Chaperone present for exam. BACK:  The back appears normal and is non-tender to palpation, there is no CVA tenderness EXT: Normal ROM in all joints; non-tender to palpation; no edema; normal capillary refill; no cyanosis, no calf tenderness or swelling    SKIN: Normal color for age and race; warm; no rash NEURO: Moves all extremities equally, sensation to light touch intact diffusely, cranial nerves II through XII intact, normal speech PSYCH: The patient's mood and manner are appropriate. Grooming and personal hygiene are appropriate.  MEDICAL DECISION MAKING: Patient here with abnormal vaginal bleeding or lower abdominal pain. Urine shows hemoglobin leukocytes likely from her vaginal bleeding. She is not having urinary symptoms. She is currently hemodynamically stable. Will treat pain with Toradol. Pelvic cultures pending. Pregnancy test negative. Will obtain transvaginal ultrasound with Doppler for further evaluation.  ED PROGRESS: 3:20 AM  Pt's hemoglobin is 9.8 this appears chronic for patient. She does have a leukocytosis but no infectious symptoms. Urine shows blood and small leukocytes but again I suspect that this is secondary to her vaginal bleeding. She is not having urinary symptoms. No  significant pelvic tenderness on exam, no adnexal tenderness or cervical motion tenderness but did have lower abdominal tenderness to palpation that was mild. Doubt appendicitis. No tenderness at McBurney's point.  Transvaginal ultrasound shows heterogeneous echotexture, endometrium of 10 mm and 3 small uterine fibroids. Normal blood flow to both ovaries. Discussed these findings with patient. I feel she is safe to be discharged home and follow-up with an OB/GYN as an outpatient. She has a PCP as well. We'll give her outpatient follow-up information. Recommended ibuprofen at home for pain. Discussed bleeding return precautions. No significant bleeding at this time. She verbalized understanding and is comfortable with this plan.    At this time, I do not feel there is any life-threatening condition present. I have reviewed and discussed all results (EKG, imaging, lab, urine as appropriate) and exam findings with patient/family. I have reviewed nursing notes and appropriate previous records.  I feel the patient is safe to be discharged home without further emergent workup and can continue workup as an outpatient as needed. Discussed usual and customary return precautions. Patient/family verbalize understanding and are comfortable with this plan.  Outpatient follow-up has been provided. All questions have been answered.   I personally performed the services described in this documentation, which was scribed in my presence. The recorded information has been reviewed and is accurate.    [place], DO 03/21/16 [phone]

## 2016-03-21 NOTE — ED Triage Notes (Addendum)
Patient reports mid/low abdominal pain with heavy vaginal bleeding onset this week , pain radiating to lower back , denies emesis or diarrhea , no fever or chills.

## 2016-05-11 NOTE — Progress Notes (Signed)
HPI:  [name] is a pleasant 46 yo with a PMH significant for migraines, seasonal allergies and GERD whom has not been her in 3 years here for several years here for an acute visit for abnormal vaginal bleeding. Had intramenstrual bleeding for several days last month with eval in emergency department. TVUS showed fibroids. Anemic on labs with mildly elevated WBCs. Reports she is here to day for a referral to gyn and a referral to ortho for back pain. Reports heavy menstrual periods monthly lasting 6-7 day with first 3 days of very heavy bleeding with clots. Denies any further/prior intermenstrual bleeding. No weakness, fevers, malaise, chills, palpitations. Wants to see gyn for pap. Reports chronic back pain for >2 years. R >L with radicular symptoms to R buttock and post leg. Reports occ feels numbness in this leg with certain movements. Denies fevers, malaise, weakness, bowel or bladder dysfunction. She wants referral to ortho. Reports referred to surgeon from ER in the past but prefers non-surgical options. No regular exercise.   ROS: See pertinent positives and negatives per HPI.  Past Medical History:  Diagnosis Date  . Allergy   . Frequent headaches   . GERD (gastroesophageal reflux disease)   . Migraines   . Urinary tract infection     Past Surgical History:  Procedure Laterality Date  . TUBAL LIGATION      Family History  Problem Relation Age of Onset  . Cancer Mother     breast ca  . Diabetes Sister   . Cancer Maternal Aunt     colon  . Cancer Maternal Grandmother     uterine    Social History   Social History  . Marital status: Single    Spouse name: N/A  . Number of children: N/A  . Years of education: N/A   Social History Main Topics  . Smoking status: Former Smoker    Types: Cigarettes    Quit date: 12/06/2013  . Smokeless tobacco: Never Used     Comment: just quit recently  . Alcohol use No  . Drug use: No  . Sexual activity: Not Asked    Other Topics Concern  . None   Social History Narrative   Work or School: CNA, [employer], doing human associates degree      Home Situation: lives with her two daughters and her son (has six children)      Spiritual Beliefs: Christian      Lifestyle:  No regular CV exercise; diet is so so              Current Outpatient Prescriptions:  .  acetaminophen (TYLENOL) 325 MG tablet, Take 650 mg by mouth every 6 (six) hours as needed for headache., Disp: , Rfl:  .  diphenhydrAMINE (BENADRYL) 25 MG tablet, Take 25 mg by mouth daily. , Disp: , Rfl:  .  cyclobenzaprine (FLEXERIL) 5 MG tablet, Take 1 tablet (5 mg total) by mouth at bedtime as needed for muscle spasms., Disp: 30 tablet, Rfl: 0  EXAM:  Vitals:   05/12/16 1313  BP: 118/70  Pulse: 89  Temp: 98.1 F (36.7 C)    Body mass index is 39.77 kg/m.  GENERAL: vitals reviewed and listed above, alert, oriented, appears well hydrated and in no acute distress  HEENT: atraumatic, conjunttiva clear, no obvious abnormalities on inspection of external nose and ears  NECK: no obvious masses on inspection  LUNGS: clear to auscultation bilaterally, no wheezes, rales or rhonchi, good  air movement  CV: HRRR, no peripheral edema  MS: moves all extremities without noticeable abnormality Normal Gait Normal inspection of back, no obvious scoliosis or leg length descrepancy No bony TTP Soft tissue TTP at: R lumbar paraspinal muscles and over pedicles on R -/+ tests: neg trendelenburg,+ facet loading R, -SLRT, -CLRT, -FABER, -FADIR Normal muscle strength, sensation to light touch and DTRs equal bilat in LEs  PSYCH: pleasant and cooperative, no obvious depression or anxiety  ASSESSMENT AND PLAN:  Discussed the following assessment and plan:  Menorrhagia with regular cycle - Plan: TSH Abnormal uterine bleeding - Plan: CBC with Differential/Platelets -repeat cbc, tsh -provided her with a list of gynecologist as  she wishes to see them for eval and treatment of her heavy bleeding -advised daily iron while having heavy bleeding  Chronic right-sided low back pain with right-sided sciatica - Plan: Ambulatory referral to Physical Medicine Rehab -HEP provided, muscle relaxer and symptomatic care discussed -referral to PMR per her request after discussion  Anemia, unspecified type - Plan: CBC with Differential/Platelets -daily iron, likely 2ndary to heavy menstrual bleeding, repeat cbc  Advised close follow up for physical in 2-3 months  -Patient advised to return or notify a doctor immediately if symptoms worsen or persist or new concerns arise.  Patient Instructions  BEFORE YOU LEAVE: -flu shot -tetanus booster -sciatica exercises -follow up: physical in 3 months  Call the numbers provided today to schedule appointment with a gynecologist for the heavy bleeding, fibroids and your pap smear.  We placed a referral for you as discussed for the back pain. It usually takes about 1-2 weeks to process and schedule this referral. If you have not heard from [place] regarding this appointment in 2 weeks please contact our office.  Do the exercises provided for the back at least 3-4 days per week.  Use heat, aleve (per instructions), topical sports cream and the muscle relaxer as needed.  Start gradually increasing gentle aerobic exercise - walking on flat surface would be good.   We recommend the following healthy lifestyle for a healthy life: 1) Small portions.   Tip: eat off of a salad plate instead of a dinner plate.  Tip: if you need more or a snack choose fruits, veggies and/or a handful of nuts or seeds.  2) Eat a healthy clean diet.  * Tip: Avoid (less then 1 serving per week): processed foods, sweets, sweetened drinks, white starches (rice, flour, bread, potatoes, pasta, etc), red meat, fast foods, butter  *Tip: CHOOSE instead   * 5-9 servings per day of fresh or frozen fruits and vegetables (but  not corn, potatoes, bananas, canned or dried fruit)   *nuts and seeds, beans   *olives and olive oil   *small portions of lean meats such as fish and white chicken    *small portions of whole grains  3)Work up to getting at least 150 minutes of sweaty aerobic exercise per week.  4)Reduce stress - consider counseling, meditation and relaxation to balance other aspects of your life.      Colin Benton R., DO

## 2016-05-12 ENCOUNTER — Encounter: Payer: Self-pay | Admitting: Family Medicine

## 2016-05-12 ENCOUNTER — Ambulatory Visit (INDEPENDENT_AMBULATORY_CARE_PROVIDER_SITE_OTHER): Payer: PRIVATE HEALTH INSURANCE | Admitting: Family Medicine

## 2016-05-12 VITALS — BP 118/70 | HR 89 | Temp 98.1°F | Ht 61.0 in | Wt 210.5 lb

## 2016-05-12 DIAGNOSIS — Z23 Encounter for immunization: Secondary | ICD-10-CM

## 2016-05-12 DIAGNOSIS — N920 Excessive and frequent menstruation with regular cycle: Secondary | ICD-10-CM | POA: Diagnosis not present

## 2016-05-12 DIAGNOSIS — G8929 Other chronic pain: Secondary | ICD-10-CM

## 2016-05-12 DIAGNOSIS — M5441 Lumbago with sciatica, right side: Secondary | ICD-10-CM | POA: Diagnosis not present

## 2016-05-12 DIAGNOSIS — D649 Anemia, unspecified: Secondary | ICD-10-CM

## 2016-05-12 DIAGNOSIS — N939 Abnormal uterine and vaginal bleeding, unspecified: Secondary | ICD-10-CM

## 2016-05-12 MED ORDER — CYCLOBENZAPRINE HCL 5 MG PO TABS: 5.0000 mg | ORAL_TABLET | Freq: Every evening | ORAL | 0 refills | Status: DC | PRN

## 2016-05-12 NOTE — Addendum Note (Signed)
Addended by: Agnes Lawrence on: 05/12/2016 02:06 PM   Modules accepted: Orders

## 2016-05-12 NOTE — Progress Notes (Signed)
Pre visit review using our clinic review tool, if applicable. No additional management support is needed unless otherwise documented below in the visit note. 

## 2016-05-12 NOTE — Patient Instructions (Signed)
BEFORE YOU LEAVE: -flu shot -tetanus booster -sciatica exercises -follow up: physical in 3 months  Call the numbers provided today to schedule appointment with a gynecologist for the heavy bleeding, fibroids and your pap smear.  We placed a referral for you as discussed for the back pain. It usually takes about 1-2 weeks to process and schedule this referral. If you have not heard from [place] regarding this appointment in 2 weeks please contact our office.  Do the exercises provided for the back at least 3-4 days per week.  Use heat, aleve (per instructions), topical sports cream and the muscle relaxer as needed.  Start gradually increasing gentle aerobic exercise - walking on flat surface would be good.   We recommend the following healthy lifestyle for a healthy life: 1) Small portions.   Tip: eat off of a salad plate instead of a dinner plate.  Tip: if you need more or a snack choose fruits, veggies and/or a handful of nuts or seeds.  2) Eat a healthy clean diet.  * Tip: Avoid (less then 1 serving per week): processed foods, sweets, sweetened drinks, white starches (rice, flour, bread, potatoes, pasta, etc), red meat, fast foods, butter  *Tip: CHOOSE instead   * 5-9 servings per day of fresh or frozen fruits and vegetables (but not corn, potatoes, bananas, canned or dried fruit)   *nuts and seeds, beans   *olives and olive oil   *small portions of lean meats such as fish and white chicken    *small portions of whole grains  3)Work up to getting at least 150 minutes of sweaty aerobic exercise per week.  4)Reduce stress - consider counseling, meditation and relaxation to balance other aspects of your life.

## 2016-05-19 ENCOUNTER — Ambulatory Visit: Payer: PRIVATE HEALTH INSURANCE | Attending: Family Medicine

## 2016-07-09 ENCOUNTER — Emergency Department (HOSPITAL_COMMUNITY): Payer: Self-pay

## 2016-07-09 ENCOUNTER — Emergency Department (HOSPITAL_COMMUNITY)
Admission: EM | Admit: 2016-07-09 | Discharge: 2016-07-09 | Disposition: A | Discharge status: Home / Self Care | Payer: Self-pay | Attending: Emergency Medicine | Admitting: Emergency Medicine

## 2016-07-09 ENCOUNTER — Encounter (HOSPITAL_COMMUNITY): Payer: Self-pay | Admitting: Emergency Medicine

## 2016-07-09 DIAGNOSIS — R1031 Right lower quadrant pain: Secondary | ICD-10-CM | POA: Insufficient documentation

## 2016-07-09 DIAGNOSIS — J069 Acute upper respiratory infection, unspecified: Secondary | ICD-10-CM | POA: Insufficient documentation

## 2016-07-09 DIAGNOSIS — M79651 Pain in right thigh: Secondary | ICD-10-CM | POA: Insufficient documentation

## 2016-07-09 DIAGNOSIS — Z87891 Personal history of nicotine dependence: Secondary | ICD-10-CM | POA: Insufficient documentation

## 2016-07-09 DIAGNOSIS — B9789 Other viral agents as the cause of diseases classified elsewhere: Secondary | ICD-10-CM

## 2016-07-09 DIAGNOSIS — R109 Unspecified abdominal pain: Secondary | ICD-10-CM

## 2016-07-09 LAB — COMPREHENSIVE METABOLIC PANEL
ALK PHOS: 62 U/L (ref 38–126)
ALT: 11 U/L — AB (ref 14–54)
ANION GAP: 11 (ref 5–15)
AST: 21 U/L (ref 15–41)
Albumin: 3.9 g/dL (ref 3.5–5.0)
BUN: 6 mg/dL (ref 6–20)
CALCIUM: 9.5 mg/dL (ref 8.9–10.3)
CO2: 23 mmol/L (ref 22–32)
CREATININE: 0.67 mg/dL (ref 0.44–1.00)
Chloride: 105 mmol/L (ref 101–111)
Glucose, Bld: 113 mg/dL — ABNORMAL HIGH (ref 65–99)
Potassium: 3.4 mmol/L — ABNORMAL LOW (ref 3.5–5.1)
SODIUM: 139 mmol/L (ref 135–145)
TOTAL PROTEIN: 7.7 g/dL (ref 6.5–8.1)
Total Bilirubin: 0.6 mg/dL (ref 0.3–1.2)

## 2016-07-09 LAB — URINALYSIS, ROUTINE W REFLEX MICROSCOPIC
Bacteria, UA: NONE SEEN
Bilirubin Urine: NEGATIVE
GLUCOSE, UA: NEGATIVE mg/dL
Ketones, ur: NEGATIVE mg/dL
NITRITE: NEGATIVE
PH: 6 (ref 5.0–8.0)
PROTEIN: 30 mg/dL — AB
SPECIFIC GRAVITY, URINE: 1.035 — AB (ref 1.005–1.030)

## 2016-07-09 LAB — CBC WITH DIFFERENTIAL/PLATELET
BASOS PCT: 1 %
Basophils Absolute: 0.1 10*3/uL (ref 0.0–0.1)
EOS ABS: 0.2 10*3/uL (ref 0.0–0.7)
EOS PCT: 2 %
HCT: 30 % — ABNORMAL LOW (ref 36.0–46.0)
HEMOGLOBIN: 8.8 g/dL — AB (ref 12.0–15.0)
Lymphocytes Relative: 37 %
Lymphs Abs: 2.8 10*3/uL (ref 0.7–4.0)
MCH: 18.8 pg — AB (ref 26.0–34.0)
MCHC: 29.3 g/dL — AB (ref 30.0–36.0)
MCV: 64 fL — AB (ref 78.0–100.0)
MONO ABS: 0.5 10*3/uL (ref 0.1–1.0)
Monocytes Relative: 7 %
NEUTROS ABS: 4 10*3/uL (ref 1.7–7.7)
NEUTROS PCT: 53 %
PLATELETS: 171 10*3/uL (ref 150–400)
RBC: 4.69 MIL/uL (ref 3.87–5.11)
RDW: 20.1 % — ABNORMAL HIGH (ref 11.5–15.5)
WBC: 7.6 10*3/uL (ref 4.0–10.5)

## 2016-07-09 LAB — I-STAT BETA HCG BLOOD, ED (MC, WL, AP ONLY): I-stat hCG, quantitative: <5 m[IU]/mL (ref <5)

## 2016-07-09 LAB — LIPASE, BLOOD: LIPASE: 16 U/L (ref 11–51)

## 2016-07-09 MED ORDER — KETOROLAC TROMETHAMINE 30 MG/ML IJ SOLN
30.0000 mg | Freq: Once | INTRAMUSCULAR | Status: AC
  Administered 2016-07-09: 30 mg via INTRAMUSCULAR
  Filled 2016-07-09: qty 1

## 2016-07-09 MED ORDER — CYCLOBENZAPRINE HCL 10 MG PO TABS: 10.0000 mg | ORAL_TABLET | Freq: Two times a day (BID) | ORAL | 0 refills | Status: DC | PRN

## 2016-07-09 MED ORDER — NAPROXEN 500 MG PO TABS: 500.0000 mg | ORAL_TABLET | Freq: Three times a day (TID) | ORAL | 0 refills | Status: DC | PRN

## 2016-07-09 NOTE — ED Triage Notes (Signed)
Patient reports non-productive cough, chills, sinus pressure x1.5 weeks, right hip/upper leg pain. Patient conscious, alert, oriented, ambulatory.

## 2016-07-09 NOTE — ED Notes (Signed)
Bed: WTR5 Expected date:  Expected time:  Means of arrival:  Comments: 

## 2016-07-09 NOTE — ED Provider Notes (Signed)
[facility] DEPT Provider Note   CSN: [id number] Arrival date & time: 07/09/16  2297     History   Chief Complaint Chief Complaint  Patient presents with  . Cough  . Chills    HPI [name] is a 46 y.o. female.  HPI 46 year old female who presents with cough and congestion as well as right flank pain radiating to the right lower quadrant. She has a history of migraine headaches. States onset of a "head cold" consisting of congestion, fullness in her sinuses, nasal drainage about a week ago that turned into a mildly productive cough of yellow to brown sputum. Has not had fevers but having chills. States a sore in her low chest wall from coughing, but no difficulty breathing, syncope or near syncope. States around  1 week ago also developed right flank pain radiating to the right lower quadrant. It is worse when she bends over or moves around. Has taken Tylenol for her symptoms, without any improvement. Pain constant, without any alleviating factors. Denies trauma, heavy lifting, exertional activity. No hematuria, but has noted diminished urine. No dysuria or urinary frequency.   Past Medical History:  Diagnosis Date  . Allergy   . Frequent headaches   . GERD (gastroesophageal reflux disease)   . Migraines   . Urinary tract infection     There are no active problems to display for this patient.   Past Surgical History:  Procedure Laterality Date  . TUBAL LIGATION      OB History    Gravida Para Term Preterm AB Living   0 0 0 0 0 0   SAB TAB Ectopic Multiple Live Births   0 0 0 0         Home Medications    Prior to Admission medications   Medication Sig Start Date End Date Taking? Authorizing Provider  acetaminophen (TYLENOL) 325 MG tablet Take 650 mg by mouth every 6 (six) hours as needed for headache.    Historical Provider, MD  cyclobenzaprine (FLEXERIL) 10 MG tablet Take 1 tablet (10 mg total) by mouth 2 (two) times daily as needed for muscle spasms.  07/09/16   Forde Dandy, MD  cyclobenzaprine (FLEXERIL) 5 MG tablet Take 1 tablet (5 mg total) by mouth at bedtime as needed for muscle spasms. 05/12/16   Lucretia Kern, DO  diphenhydrAMINE (BENADRYL) 25 MG tablet Take 25 mg by mouth daily.     Historical Provider, MD  naproxen (NAPROSYN) 500 MG tablet Take 1 tablet (500 mg total) by mouth 3 (three) times daily with meals as needed for mild pain or moderate pain. 07/09/16   Forde Dandy, MD    Family History Family History  Problem Relation Age of Onset  . Cancer Mother     breast ca  . Diabetes Sister   . Cancer Maternal Aunt     colon  . Cancer Maternal Grandmother     uterine    Social History Social History  Substance Use Topics  . Smoking status: Former Smoker    Types: Cigarettes    Quit date: 12/06/2013  . Smokeless tobacco: Never Used     Comment: just quit recently  . Alcohol use No     Allergies   Patient has no known allergies.   Review of Systems Review of Systems 10/14 systems reviewed and are negative other than those stated in the HPI   Physical Exam Updated Vital Signs BP 138/85 (BP Location: Right Arm)  Pulse 82   Temp 98.2 F (36.8 C) (Oral)   Resp 14   Ht 5\' 2"  (1.575 m)   Wt 210 lb (95.3 kg)   LMP 06/25/2016   SpO2 100%   BMI 38.41 kg/m   Physical Exam Physical Exam  Nursing note and vitals reviewed. Constitutional: Well developed, well nourished, non-toxic, and in no acute distress Head: Normocephalic and atraumatic.  Mouth/Throat: Oropharynx is clear and moist.  Neck: Normal range of motion. Neck supple.  Cardiovascular: Normal rate and regular rhythm.   Pulmonary/Chest: Effort normal and breath sounds normal.  Abdominal: Soft. There is RLQ tenderness. There is no rebound and no guarding. moderate right CVA tenderness.  Musculoskeletal: Normal range of motion.  Neurological: Alert, no facial droop, fluent speech, moves all extremities symmetrically Skin: Skin is warm and dry.    Psychiatric: Cooperative   ED Treatments / Results  Labs (all labs ordered are listed, but only abnormal results are displayed) Labs Reviewed  CBC WITH DIFFERENTIAL/PLATELET - Abnormal; Notable for the following:       Result Value   Hemoglobin 8.8 (*)    HCT 30.0 (*)    MCV 64.0 (*)    MCH 18.8 (*)    MCHC 29.3 (*)    RDW 20.1 (*)    All other components within normal limits  COMPREHENSIVE METABOLIC PANEL - Abnormal; Notable for the following:    Potassium 3.4 (*)    Glucose, Bld 113 (*)    ALT 11 (*)    All other components within normal limits  URINALYSIS, ROUTINE W REFLEX MICROSCOPIC - Abnormal; Notable for the following:    APPearance HAZY (*)    Specific Gravity, Urine 1.035 (*)    Hgb urine dipstick SMALL (*)    Protein, ur 30 (*)    Leukocytes, UA TRACE (*)    Squamous Epithelial / LPF 6-30 (*)    All other components within normal limits  LIPASE, BLOOD  I-STAT BETA HCG BLOOD, ED ([facility], [facility], [facility] ONLY)  I-STAT BETA HCG BLOOD, ED ([facility], [facility], [facility] ONLY)    EKG  EKG Interpretation None       Radiology Dg Chest 2 View  Result Date: 07/09/2016 CLINICAL DATA:  Cough, congestion, mid chest pain for 1-2 weeks EXAM: CHEST  2 VIEW COMPARISON:  Chest x-ray of 04/19/2015 FINDINGS: No active infiltrate or effusion is seen. The lungs are slightly hyperaerated. Mediastinal and hilar contours are unremarkable. The heart is within normal limits in size. No bony abnormality is seen. IMPRESSION: No active cardiopulmonary disease.  Slight hyper aeration. Electronically Signed   By: Ivar Drape M.D.   On: 07/09/2016 11:09   Ct Renal Stone Study  Result Date: 07/09/2016 CLINICAL DATA:  Right flank and groin pain for 10 days. EXAM: CT ABDOMEN AND PELVIS WITHOUT CONTRAST TECHNIQUE: Multidetector CT imaging of the abdomen and pelvis was performed following the standard protocol without IV contrast. COMPARISON:  CT scan 04/19/2015 FINDINGS: Lower chest: The lung bases are clear. The heart is  normal in size. No pericardial effusion. The distal esophagus is grossly normal. Hepatobiliary: No focal hepatic lesions or intrahepatic biliary dilatation. The gallbladder is normal. No common bile duct dilatation. A Pancreas: Normal Spleen: Normal Adrenals/Urinary Tract: The adrenal glands are normal. No renal, ureteral or bladder calculi or mass. Stomach/Bowel: The stomach, duodenum, small bowel and colon are grossly normal without oral contrast. No inflammatory changes, mass lesions or obstructive findings. The terminal ileum and appendix are normal. Vascular/Lymphatic: Minimal  aortic calcification. No aneurysm. No mesenteric or retroperitoneal mass or adenopathy. Reproductive: Normal uterus and ovaries. Other: No pelvic mass or adenopathy. No free pelvic fluid collections. No inguinal mass or adenopathy. No abdominal wall hernia or subcutaneous lesions. Musculoskeletal: No significant bony findings. There are moderate facet joint degenerative changes bilaterally at L4-5 and L5-S1 with subchondral cystic changes or erosions. IMPRESSION: No renal, ureteral or bladder calculi or mass. No acute abdominal/ pelvic findings, mass lesions or lymphadenopathy. 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 Electronically Signed   By: Marijo Sanes M.D.   On: 07/09/2016 12:18    Procedures Procedures (including critical care time)  Medications Ordered in ED Medications  ketorolac (TORADOL) 30 MG/ML injection 30 mg (30 mg Intramuscular Given 07/09/16 1127)     Initial Impression / Assessment and Plan / ED Course  I have reviewed the triage vital signs and the nursing notes.  Pertinent labs & imaging results that were available during my care of the patient were reviewed by me and considered in my medical decision making (see chart for details).     Well appearing and in no acute distress with normal vitals. Abdomen is soft and non-peritoneal. Abdominal pain felt to be likely MSK given 1  week of ongoing symptoms, worse with movement. Less likely appendicitis given eating drinking normally with one week of symptoms and no other signs of sepsis. Possible urolithiasis, but no urinary complaints.   CT abd/pelvis visualized, Does not show any intra-abdominal, intrapelvic or retroperitoneal processes. Pain, worse with movement and bending over seems most c/w MSK etiology. Urinalysis without infection. She is not pregnant.  Chest x-ray visualized, does not show pneumonia or other acute cardiopulmonary processes. Her symptoms are also likely due to viral respiratory illness. Discussed continued supportive care management for this.  The patient appears reasonably screened and/or stabilized for discharge and I doubt any other medical condition or other [facility] requiring further screening, evaluation, or treatment in the ED at this time prior to discharge.  Strict return and follow-up instructions reviewed. She expressed understanding of all discharge instructions and felt comfortable with the plan of care.     Final Clinical Impressions(s) / ED Diagnoses   Final diagnoses:  Viral URI with cough  Flank pain  Musculoskeletal pain of right thigh    New Prescriptions New Prescriptions   CYCLOBENZAPRINE (FLEXERIL) 10 MG TABLET    Take 1 tablet (10 mg total) by mouth 2 (two) times daily as needed for muscle spasms.   NAPROXEN (NAPROSYN) 500 MG TABLET    Take 1 tablet (500 mg total) by mouth 3 (three) times daily with meals as needed for mild pain or moderate pain.     Forde Dandy, MD 07/09/16 [phone]

## 2016-07-09 NOTE — ED Provider Notes (Signed)
MSE was initiated and I personally evaluated the patient and placed orders (if any) at  10:18 AM on July 09, 2016.  [name] is a 45 y.o. female with a PMHx of migraines, GERD, and allergies, who presents to the ED with complaints of 1wk of dry cough and congestion unrelieved with sudafed, and also complained of associated chest pain. She also complains secondarily of RLQ abd pain and R groin/hip pain. When asked what brought her in, she stated "you can have two diagnoses, I came in for both of these".   Given her complaint, advised pt that she would be moved to a more appropriate area.   The patient appears stable so that the remainder of the MSE may be completed by another provider.   [address] PA-C 07/09/16 [place] Liu, MD 07/09/16 [phone]

## 2016-07-09 NOTE — Discharge Instructions (Signed)
Your cough is likely due to virus and can take 1-2 weeks to get better. Drink fluids and get rest.  Your ct does not show anything acute in the abdomen. Your pain seems muscle strain related. Take medications as prescribed.  Return for worsening symptoms, including fever, intractable vomiting, confusion, worsening pain, or any other symptoms concerning to you.

## 2016-08-18 ENCOUNTER — Telehealth: Payer: Self-pay | Admitting: Family Medicine

## 2016-08-18 NOTE — Telephone Encounter (Signed)
° °  Pt call to ask for a referral to see someone about her leg and back pain. She said she was referred one time before but she missed the appointment. She said it is in her file what is wrong with her

## 2016-08-18 NOTE — Telephone Encounter (Signed)
Patient was referred to [facility] PT in 05/2016 [phone]). I attempted to call the pt to give her the phone number to call for an appt and was unable to leave a message due to the pts voicemail being full.

## 2016-08-19 ENCOUNTER — Encounter: Payer: PRIVATE HEALTH INSURANCE | Admitting: Family Medicine

## 2016-08-22 ENCOUNTER — Encounter: Payer: PRIVATE HEALTH INSURANCE | Admitting: Family Medicine

## 2016-09-05 ENCOUNTER — Telehealth: Payer: Self-pay | Admitting: Family Medicine

## 2016-09-05 ENCOUNTER — Emergency Department (HOSPITAL_COMMUNITY)
Admission: EM | Admit: 2016-09-05 | Discharge: 2016-09-05 | Disposition: A | Discharge status: Home / Self Care | Payer: PRIVATE HEALTH INSURANCE | Attending: Emergency Medicine | Admitting: Emergency Medicine

## 2016-09-05 DIAGNOSIS — M5431 Sciatica, right side: Secondary | ICD-10-CM | POA: Diagnosis not present

## 2016-09-05 DIAGNOSIS — Z87891 Personal history of nicotine dependence: Secondary | ICD-10-CM | POA: Insufficient documentation

## 2016-09-05 DIAGNOSIS — M79604 Pain in right leg: Secondary | ICD-10-CM | POA: Diagnosis present

## 2016-09-05 MED ORDER — PREDNISONE 20 MG PO TABS
60.0000 mg | ORAL_TABLET | Freq: Once | ORAL | Status: AC
  Administered 2016-09-05: 60 mg via ORAL
  Filled 2016-09-05: qty 3

## 2016-09-05 MED ORDER — KETOROLAC TROMETHAMINE 30 MG/ML IJ SOLN
60.0000 mg | Freq: Once | INTRAMUSCULAR | Status: AC
  Administered 2016-09-05: 60 mg via INTRAMUSCULAR
  Filled 2016-09-05: qty 2

## 2016-09-05 MED ORDER — PREDNISONE 10 MG PO TABS: 40.0000 mg | ORAL_TABLET | Freq: Every day | ORAL | 0 refills | Status: DC

## 2016-09-05 NOTE — Telephone Encounter (Signed)
Pt scheduled  

## 2016-09-05 NOTE — Discharge Instructions (Signed)
As discussed, take prednisone for the next 4 days and follow-up with her primary care provider. Apply heat to the area and modify your exercises using good posture. Your exam was very reassuring and your pain is consistent with sciatica.  Return to the emergency department if she loses bladder or bowel control, cannot feel either extremity or any other new concerning symptoms in the meantime.

## 2016-09-05 NOTE — Progress Notes (Signed)
HPI:  [name] is a pleasant 46 yo with a PMH chronic low back pain, GERD, migraines, seasonal allergies, fibroids here for an acute visit for back pain: -chronic, > 2 years intermittently per notes, R>L with radicular symptoms -referred to specialist in march, went to ER for this yesterday (hx frequent ER visits, rarely comes here, hx of noncompliance with recs and follow up advice; per referral notes appears they tried to contact here but could not due to VM full -she reports nobody called her and insurance issues and wants re-referral to specialist -reports intermittent flares of low back pain with radiation to buttocks with this flare starting a few days ago -pain is mod-severe in R low back radiating to R buttock and leg, feels tingly at times  -denies fevers, weakness, malaise, bowel or bladder incontinence -due for physical, pap with gyn    ROS: See pertinent positives and negatives per HPI.  Past Medical History:  Diagnosis Date  . Allergy   . Frequent headaches   . GERD (gastroesophageal reflux disease)   . Migraines   . Urinary tract infection     Past Surgical History:  Procedure Laterality Date  . TUBAL LIGATION      Family History  Problem Relation Age of Onset  . Cancer Mother        breast ca  . Diabetes Sister   . Cancer Maternal Aunt        colon  . Cancer Maternal Grandmother        uterine    Social History   Social History  . Marital status: Single    Spouse name: N/A  . Number of children: N/A  . Years of education: N/A   Social History Main Topics  . Smoking status: Former Smoker    Types: Cigarettes    Quit date: 12/06/2013  . Smokeless tobacco: Never Used     Comment: just quit recently  . Alcohol use No  . Drug use: No  . Sexual activity: Not Asked   Other Topics Concern  . None   Social History Narrative   Work or School: CNA, [employer], doing human associates degree      Home Situation: lives with her  two daughters and her son (has six children)      Spiritual Beliefs: Christian      Lifestyle:  No regular CV exercise; diet is so so              Current Outpatient Prescriptions:  .  acetaminophen (TYLENOL) 325 MG tablet, Take 650 mg by mouth every 6 (six) hours as needed for headache., Disp: , Rfl:  .  diphenhydrAMINE (BENADRYL) 25 MG tablet, Take 25 mg by mouth daily. , Disp: , Rfl:  .  IBUPROFEN PO, Take by mouth., Disp: , Rfl:  .  cyclobenzaprine (FLEXERIL) 5 MG tablet, Take 1 tablet (5 mg total) by mouth 3 (three) times daily as needed for muscle spasms., Disp: 30 tablet, Rfl: 1 .  predniSONE (DELTASONE) 10 MG tablet, 30 mg x2 days, then 20mg  x2 days then 10mg  x 2 days, Disp: 12 tablet, Rfl: 0  EXAM:  Vitals:   09/08/16 1122  BP: 124/80  Pulse: 90  Temp: 98.1 F (36.7 C)    Body mass index is 35.24 kg/m.  GENERAL: vitals reviewed and listed above, alert, oriented, appears well hydrated and in no acute distress  HEENT: atraumatic, conjunttiva clear, no obvious abnormalities on inspection of external nose  and ears  NECK: no obvious masses on inspection  LUNGS: clear to auscultation bilaterally, no wheezes, rales or rhonchi, good air movement  CV: HRRR, no peripheral edema  MS: moves all extremities without noticeable abnormality Normal Gait Normal inspection of back, no obvious scoliosis or leg length descrepancy No bony TTP Soft tissue TTP at: R lowe lumbar paraspinal muscle minimally -/+ tests: neg trendelenburg,-facet loading, -SLRT, -CLRT, -FABER, -FADIR Normal muscle strength, sensation to light touch and DTRs in LEs bilaterally  PSYCH: pleasant and cooperative, no obvious depression or anxiety  ASSESSMENT AND PLAN:  Discussed the following assessment and plan:  Acute right-sided low back pain with right-sided sciatica - Plan: Ambulatory referral to Orthopedic Surgery -exam without focal neuro deficits, referral to specialist -taper steroid once  finished with burst provided by ER -refilled flexeril -HEP, other symptomatic care measures discussed -return and ER precuations  Varicose veins of both lower extremities - Plan: Ambulatory referral to Vascular Surgery -the referral demanded at end of appt - referral placed  -advised to schedule physical  -Patient advised to return or notify a doctor immediately if symptoms worsen or persist or new concerns arise.  Patient Instructions  BEFORE YOU LEAVE: -follow up: CPE in 1-2 months -low back exercises  Heat for 15 minutes twice daily - can try a rice sock.  For pain can try Tiger Balm and/or tylenol 500-1000mg  up to 2-3 times daily  Do the exercises 4 days per week  Flexeril as needed for muscle spasm per instructions  We placed a referral for you as discussed to the specialist about your back issues. It usually takes about 1-2 weeks to process and schedule this referral. If you have not heard from [place] regarding this appointment in 2 weeks please contact our office.  I hope you are feeling better soon! Seek care sooner if worsening, new concerns or you are not improving with treatment.      Colin Benton R., DO

## 2016-09-05 NOTE — Telephone Encounter (Signed)
Appt scheduled for 6/4.

## 2016-09-05 NOTE — ED Triage Notes (Signed)
Pt from home w/ c/o pain from R leg to butt/hip area. Sts its been going on x2-3wks. Tried tylenol w/ no relief. Also sts she fell today due to pain being so bad she couldn't move leg. Rates pain @ 10/10.

## 2016-09-05 NOTE — Telephone Encounter (Signed)
Pt went to [place] last night due to fall. Pt would like a refill on flexeril [address]. Pt also would like another referral to [facility] for back and leg pain. Pt said she call [facility] in feb and they could not provide her with their address

## 2016-09-05 NOTE — ED Provider Notes (Signed)
[facility] DEPT Provider Note   CSN: [id number] Arrival date & time: 09/05/16  0504     History   Chief Complaint Chief Complaint  Patient presents with  . Leg Pain    HPI [name] is a 46 y.o. female presenting with chronic right-sided sciatica pain for over 2 weeks which has been worsening over the last 2 days. Her pain is starting in her buttocks and radiating down her leg like electrical conduction. She states she has had this pain in the past but not this bad. She has tried heat and Tylenol with mild relief. She also reports pins and needles in her foot. Denies loss of bowel or bladder function, history of malignancy, IV drug use, fever.  HPI  Past Medical History:  Diagnosis Date  . Allergy   . Frequent headaches   . GERD (gastroesophageal reflux disease)   . Migraines   . Urinary tract infection     There are no active problems to display for this patient.   Past Surgical History:  Procedure Laterality Date  . TUBAL LIGATION      OB History    Gravida Para Term Preterm AB Living   0 0 0 0 0 0   SAB TAB Ectopic Multiple Live Births   0 0 0 0         Home Medications    Prior to Admission medications   Medication Sig Start Date End Date Taking? Authorizing Provider  acetaminophen (TYLENOL) 325 MG tablet Take 650 mg by mouth every 6 (six) hours as needed for headache.    [provider]  cyclobenzaprine (FLEXERIL) 10 MG tablet Take 1 tablet (10 mg total) by mouth 2 (two) times daily as needed for muscle spasms. 07/09/16   Forde Dandy, MD  cyclobenzaprine (FLEXERIL) 5 MG tablet Take 1 tablet (5 mg total) by mouth at bedtime as needed for muscle spasms. 05/12/16   Lucretia Kern, DO  diphenhydrAMINE (BENADRYL) 25 MG tablet Take 25 mg by mouth daily.     [provider]  naproxen (NAPROSYN) 500 MG tablet Take 1 tablet (500 mg total) by mouth 3 (three) times daily with meals as needed for mild pain or moderate pain. 07/09/16   Forde Dandy, MD  predniSONE (DELTASONE) 10 MG tablet Take 4 tablets (40 mg total) by mouth daily. 09/05/16 09/09/16  Emeline General, PA-C    Family History Family History  Problem Relation Age of Onset  . Cancer Mother        breast ca  . Diabetes Sister   . Cancer Maternal Aunt        colon  . Cancer Maternal Grandmother        uterine    Social History Social History  Substance Use Topics  . Smoking status: Former Smoker    Types: Cigarettes    Quit date: 12/06/2013  . Smokeless tobacco: Never Used     Comment: just quit recently  . Alcohol use No     Allergies   Patient has no known allergies.   Review of Systems Review of Systems  Constitutional: Negative for chills and fever.  Respiratory: Negative for cough, shortness of breath, wheezing and stridor.   Cardiovascular: Negative for chest pain and palpitations.  Gastrointestinal: Negative for abdominal pain, nausea and vomiting.  Genitourinary: Negative for difficulty urinating, dysuria, flank pain, frequency, hematuria and pelvic pain.  Musculoskeletal: Positive for back pain and myalgias. Negative for arthralgias, gait problem,  joint swelling, neck pain and neck stiffness.  Skin: Negative for color change, pallor and rash.  Neurological: Negative for seizures, syncope, weakness and numbness.     Physical Exam Updated Vital Signs BP 101/64   Pulse 83   Temp 97.7 F (36.5 C) (Oral)   Resp 18   Ht 5\' 1"  (1.549 m)   Wt 90.7 kg (200 lb)   SpO2 98%   BMI 37.79 kg/m   Physical Exam  Constitutional: She appears well-developed and well-nourished. No distress.  Afebrile, nontoxic-appearing, lying comfortably in bed sleeping in no acute distress.  HENT:  Head: Normocephalic and atraumatic.  Eyes: EOM are normal.  Neck: Normal range of motion. Neck supple.  Cardiovascular: Normal rate, regular rhythm and intact distal pulses.   No murmur heard. Pulmonary/Chest: Effort normal and breath sounds normal. No respiratory  distress. She has no wheezes. She has no rales.  Abdominal: She exhibits no distension.  Musculoskeletal: Normal range of motion. She exhibits tenderness. She exhibits no edema or deformity.  Neurological: She is alert. No sensory deficit. She exhibits normal muscle tone. Coordination normal.  Neurologic Exam:  - Mental status: Patient is alert and cooperative. Fluent speech and words are clear. Coherent thought processes and insight is good. Patient is oriented x 4 to person, place, time and event.  - Motor: No involuntary movements. Muscle tone and bulk normal throughout. Muscle strength is 5/5 in bilateral  hip extension, flexion, leg flexion and extension, ankle dorsiflexion and plantar flexion.  - Sensory: Proprioception, light tough sensation intact in all extremities.   Normal stance and gait.  Skin: Skin is warm and dry. No rash noted. She is not diaphoretic. No erythema. No pallor.  Psychiatric: She has a normal mood and affect.  Nursing note and vitals reviewed.    ED Treatments / Results  Labs (all labs ordered are listed, but only abnormal results are displayed) Labs Reviewed - No data to display  EKG  EKG Interpretation None       Radiology No results found.  Procedures Procedures (including critical care time)  Medications Ordered in ED Medications  predniSONE (DELTASONE) tablet 60 mg (60 mg Oral Given 09/05/16 3790)     Initial Impression / Assessment and Plan / ED Course  I have reviewed the triage vital signs and the nursing notes.  Pertinent labs & imaging results that were available during my care of the patient were reviewed by me and considered in my medical decision making (see chart for details).     Patient was dozing off to sleep as I was gathering history and she reported 10 out of 10 pain at this time. Normal neuro exam. No red flags. Neurovascularly intact distally. Normal stance and gait. She was comfortably sleeping in bed.  We'll treat with  prednisone, heat and close follow-up with PCP.  Patient presents with lower back pain.  No midline tenderness palpation. No gross neurological deficits and normal neuro exam.  Patient has no gait abnormality or concern for cauda equina.  No loss of bowel or bladder control, fever, night sweats, weight loss, h/o malignancy, or IVDU.  RICE protocol and pain medications indicated and discussed with patient.   Discussed strict return precautions and advised to return to the emergency department if experiencing any new or worsening symptoms. Instructions were understood and patient agreed with discharge plan. Final Clinical Impressions(s) / ED Diagnoses   Final diagnoses:  Sciatica of right side    New Prescriptions New Prescriptions  PREDNISONE (DELTASONE) 10 MG TABLET    Take 4 tablets (40 mg total) by mouth daily.     Emeline General, PA-C 09/05/16 [place], Delice [facility], DO 09/05/16 [id number]

## 2016-09-05 NOTE — Telephone Encounter (Signed)
Look like ER advised f/u appt here. Help her to schedule so we can help her. Thanks!

## 2016-09-05 NOTE — Telephone Encounter (Signed)
Unable to leave message mailbox full.

## 2016-09-08 ENCOUNTER — Ambulatory Visit (INDEPENDENT_AMBULATORY_CARE_PROVIDER_SITE_OTHER): Payer: PRIVATE HEALTH INSURANCE | Admitting: Family Medicine

## 2016-09-08 ENCOUNTER — Encounter: Payer: Self-pay | Admitting: Family Medicine

## 2016-09-08 VITALS — BP 124/80 | HR 90 | Temp 98.1°F | Ht 61.0 in | Wt 186.5 lb

## 2016-09-08 DIAGNOSIS — M5441 Lumbago with sciatica, right side: Secondary | ICD-10-CM | POA: Diagnosis not present

## 2016-09-08 DIAGNOSIS — I8393 Asymptomatic varicose veins of bilateral lower extremities: Secondary | ICD-10-CM

## 2016-09-08 MED ORDER — PREDNISONE 10 MG PO TABS: ORAL_TABLET | ORAL | 0 refills | Status: DC

## 2016-09-08 MED ORDER — CYCLOBENZAPRINE HCL 5 MG PO TABS: 5.0000 mg | ORAL_TABLET | Freq: Three times a day (TID) | ORAL | 1 refills | Status: DC | PRN

## 2016-09-08 NOTE — Patient Instructions (Signed)
BEFORE YOU LEAVE: -follow up: CPE in 1-2 months -low back exercises  Heat for 15 minutes twice daily - can try a rice sock.  For pain can try Tiger Balm and/or tylenol 500-1000mg  up to 2-3 times daily  Do the exercises 4 days per week  Flexeril as needed for muscle spasm per instructions  We placed a referral for you as discussed to the specialist about your back issues. It usually takes about 1-2 weeks to process and schedule this referral. If you have not heard from [place] regarding this appointment in 2 weeks please contact our office.  I hope you are feeling better soon! Seek care sooner if worsening, new concerns or you are not improving with treatment.

## 2016-09-12 ENCOUNTER — Telehealth (INDEPENDENT_AMBULATORY_CARE_PROVIDER_SITE_OTHER): Payer: Self-pay | Admitting: *Deleted

## 2016-09-12 NOTE — Telephone Encounter (Signed)
Called pt to reschedule to Tuesday, Wednesday, or Thursday AM appt. Will wait for pt to call back.

## 2016-09-12 NOTE — Telephone Encounter (Signed)
-----   Message from Sherre Scarlet, [place] sent at 09/12/2016  9:47 AM EDT ----- Regarding: Reschedule This patient will need to be rescheduled- New Patients for Dr. Ernestina Patches Tuesday, Wednesday, and Thursday AM only for 30 min.

## 2016-09-15 NOTE — Telephone Encounter (Signed)
Called pt again to reschedule this appt. She answered and I told her I needed to move her appt to a different day in the AM and we got disconnected. I called back, the voice mail box is full and I was not able to leave a VM. Will try to call back back at a later time.

## 2016-09-16 NOTE — Telephone Encounter (Signed)
I [name] not to worry about calling pt again. The first patient that afternoon is a mri review so an ov after that will be fine.

## 2016-09-17 ENCOUNTER — Telehealth: Payer: Self-pay | Admitting: Family Medicine
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
## 2016-09-18 ENCOUNTER — Encounter (INDEPENDENT_AMBULATORY_CARE_PROVIDER_SITE_OTHER): Payer: Self-pay | Admitting: Physical Medicine and Rehabilitation

## 2016-09-18 ENCOUNTER — Ambulatory Visit (INDEPENDENT_AMBULATORY_CARE_PROVIDER_SITE_OTHER): Payer: PRIVATE HEALTH INSURANCE | Admitting: Physical Medicine and Rehabilitation

## 2016-09-18 ENCOUNTER — Ambulatory Visit (INDEPENDENT_AMBULATORY_CARE_PROVIDER_SITE_OTHER): Payer: PRIVATE HEALTH INSURANCE

## 2016-09-18 VITALS — BP 131/86 | HR 99 | Ht 62.0 in | Wt 186.0 lb

## 2016-09-18 DIAGNOSIS — M25551 Pain in right hip: Secondary | ICD-10-CM

## 2016-09-18 DIAGNOSIS — M5416 Radiculopathy, lumbar region: Secondary | ICD-10-CM

## 2016-09-18 DIAGNOSIS — R202 Paresthesia of skin: Secondary | ICD-10-CM

## 2016-09-18 DIAGNOSIS — M5441 Lumbago with sciatica, right side: Secondary | ICD-10-CM

## 2016-09-18 DIAGNOSIS — G8929 Other chronic pain: Secondary | ICD-10-CM

## 2016-09-18 MED ORDER — BACLOFEN 10 MG PO TABS: 10.0000 mg | ORAL_TABLET | Freq: Three times a day (TID) | ORAL | 0 refills | Status: DC | PRN

## 2016-09-18 MED ORDER — NABUMETONE 500 MG PO TABS: 500.0000 mg | ORAL_TABLET | Freq: Two times a day (BID) | ORAL | 0 refills | Status: AC
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
## 2016-09-19 ENCOUNTER — Telehealth (INDEPENDENT_AMBULATORY_CARE_PROVIDER_SITE_OTHER): Payer: Self-pay | Admitting: Radiology

## 2016-09-19 NOTE — Telephone Encounter (Signed)
Would advise that she be seen sooner if she has any acute weakness- otherwise okay to address with orthopedics next week

## 2016-09-19 NOTE — Telephone Encounter (Signed)
Dr. Julianne Rice office called states that patient just saw Dr. Ernestina Patches recently (09/18/16) states that the patient is having increased leg pain and weakness over last 4 days, having trouble walking and can't drive.  I advised that she should go to the ER for this, as Dr. Ernestina Patches is out and this is rather acute.  Dr. Julianne Rice office will call patient and have her to go to the ER.  I did advise that there they can do an MRI and then she can follow up with Dr. Ernestina Patches next week.

## 2016-09-19 NOTE — Telephone Encounter (Signed)
I called the pt and she stated she has had tingling, pain and weakness in both lower extremities for the past 4 days.  States the weakness is so severe in the thigh and calf area that she cannot walk, had to leave work, cannot drive herself so she is having her 48 year old child drive her around.  States she was just seen by the specialist and told him this and stated she is not sure if he was aware of her symptoms.  I called [employer] (pt was seen by Dr Ernestina Patches) and spoke with [facility] in triage and she stated the physician is not there and Dr Lorin Mercy is in the office but has not seen the pt and is not sure what could be done for her.  I informed Dr Elease Hashimoto of this and he stated the pt should go to the ER for evaluation, imaging and I called the pt and informed her of this.

## 2016-09-22 NOTE — Telephone Encounter (Signed)
Thanks for copying me to keep [place] in the loop. I was out of the office but agree with recs. Appreciate care.

## 2016-09-22 NOTE — Telephone Encounter (Signed)
I saw the patient on 6/14, this call is on the 15th and we had examined her and placed order for MRI on the day we saw her. It does not appear that she has gone to ED? Can we get an idea on when MRI scheduled

## 2016-09-22 NOTE — Telephone Encounter (Signed)
MRI has been scheduled for 09/28/2016

## 2016-09-24 ENCOUNTER — Ambulatory Visit (INDEPENDENT_AMBULATORY_CARE_PROVIDER_SITE_OTHER): Payer: Self-pay | Admitting: Physical Medicine and Rehabilitation

## 2016-09-26 ENCOUNTER — Encounter (INDEPENDENT_AMBULATORY_CARE_PROVIDER_SITE_OTHER): Payer: Self-pay | Admitting: Physical Medicine and Rehabilitation

## 2016-09-27 ENCOUNTER — Emergency Department (HOSPITAL_COMMUNITY): Payer: PRIVATE HEALTH INSURANCE

## 2016-09-27 ENCOUNTER — Encounter (HOSPITAL_COMMUNITY): Payer: Self-pay | Admitting: *Deleted

## 2016-09-27 ENCOUNTER — Emergency Department (HOSPITAL_COMMUNITY)
Admission: EM | Admit: 2016-09-27 | Discharge: 2016-09-27 | Disposition: A | Discharge status: Home / Self Care | Payer: PRIVATE HEALTH INSURANCE | Attending: Emergency Medicine | Admitting: Emergency Medicine

## 2016-09-27 DIAGNOSIS — M79605 Pain in left leg: Secondary | ICD-10-CM | POA: Diagnosis not present

## 2016-09-27 DIAGNOSIS — Y33XXXA Other specified events, undetermined intent, initial encounter: Secondary | ICD-10-CM | POA: Insufficient documentation

## 2016-09-27 DIAGNOSIS — Y939 Activity, unspecified: Secondary | ICD-10-CM | POA: Diagnosis not present

## 2016-09-27 DIAGNOSIS — Z87891 Personal history of nicotine dependence: Secondary | ICD-10-CM | POA: Insufficient documentation

## 2016-09-27 DIAGNOSIS — Y998 Other external cause status: Secondary | ICD-10-CM | POA: Diagnosis not present

## 2016-09-27 DIAGNOSIS — Y929 Unspecified place or not applicable: Secondary | ICD-10-CM | POA: Diagnosis not present

## 2016-09-27 DIAGNOSIS — M5410 Radiculopathy, site unspecified: Secondary | ICD-10-CM

## 2016-09-27 DIAGNOSIS — S3421XA Injury of nerve root of lumbar spine, initial encounter: Secondary | ICD-10-CM | POA: Diagnosis not present

## 2016-09-27 DIAGNOSIS — M5416 Radiculopathy, lumbar region: Secondary | ICD-10-CM

## 2016-09-27 MED ORDER — METHYLPREDNISOLONE 4 MG PO TBPK: ORAL_TABLET | ORAL | 0 refills | Status: DC

## 2016-09-27 MED ORDER — MORPHINE SULFATE (PF) 4 MG/ML IV SOLN
6.0000 mg | Freq: Once | INTRAVENOUS | Status: AC
  Administered 2016-09-27: 6 mg via INTRAVENOUS
  Filled 2016-09-27: qty 2

## 2016-09-27 MED ORDER — OXYCODONE-ACETAMINOPHEN 5-325 MG PO TABS: 1.0000 | ORAL_TABLET | ORAL | 0 refills | Status: DC | PRN

## 2016-09-27 MED ORDER — DEXAMETHASONE SODIUM PHOSPHATE 10 MG/ML IJ SOLN
10.0000 mg | Freq: Once | INTRAMUSCULAR | Status: AC
  Administered 2016-09-27: 10 mg via INTRAVENOUS
  Filled 2016-09-27: qty 1

## 2016-09-27 MED ORDER — KETOROLAC TROMETHAMINE 30 MG/ML IJ SOLN
30.0000 mg | Freq: Once | INTRAMUSCULAR | Status: AC
  Administered 2016-09-27: 30 mg via INTRAVENOUS
  Filled 2016-09-27: qty 1

## 2016-09-27 MED ORDER — ONDANSETRON HCL 4 MG/2ML IJ SOLN: 4.0000 mg | Freq: Four times a day (QID) | INTRAMUSCULAR | Status: DC | PRN

## 2016-09-27 NOTE — Discharge Instructions (Signed)
You have been evaluated for your back and leg pain.  It appears there is a cyst near your nerve root causing nerve impingement which leads to leg pain and tingling sensation.  Please take medications prescribed.  Call and follow up with Dr. Saintclair Halsted next week for further management.  Return if you have worsening leg weakness, increase pain, or having trouble with bowel or bladder movement.

## 2016-09-27 NOTE — ED Triage Notes (Addendum)
To ED for eval of right leg pain and numbness. States she has been tripping due to numbness. Unable to sleep. Unable to drive. Pt is scheduled for MRI tomorrow but unable to stand pain any longer. Pt states this pain has been progressing over the past few weeks. Hx of a fall a few months ago prior to pain started. Pt was on steroids approx 3 wks ago but no relief. Pt visibly uncomfortable. No problems with bowel or bladder

## 2016-09-27 NOTE — ED Notes (Signed)
ED Provider at bedside. 

## 2016-09-27 NOTE — ED Provider Notes (Signed)
[facility] DEPT Provider Note   CSN: [id number] Arrival date & time: 09/27/16  0932     History   Chief Complaint Chief Complaint  Patient presents with  . Leg Pain    HPI [name] is a 46 y.o. female.  HPI   46 year old female presenting with complaint of right leg pain and numbness. Patient report for the past 3 weeks she has had progressive worsening low back pain that radiates down her right leg. She described pain as a sharp tightness sensation worsening with prolonged laying or with certain movement. She also report tingling sensation down her right leg affecting her great toe. She is having difficulty sitting for prolonged amount of time. Also having trouble driving her car due to her discomfort. She has been taking medication prescribed by her orthopedist, who saw her several weeks ago for the same complaint. Patient prescribed baclofen, and nabumetone, however it has not helped. She also tried over-the-counter treatment including Epsom salts bath without relief. She is scheduled to have and lumbar spine MRI tomorrow, but decided to come in today because her pain is intense. She denies having fever, chills, abdominal pain, dysuria, hematuria, bowel bladder incontinence or saddle anesthesia. No history of IV drug use or active cancer. Denies any recent trauma.  Past Medical History:  Diagnosis Date  . Allergy   . Frequent headaches   . GERD (gastroesophageal reflux disease)   . Migraines   . Urinary tract infection     There are no active problems to display for this patient.   Past Surgical History:  Procedure Laterality Date  . TUBAL LIGATION      OB History    Gravida Para Term Preterm AB Living   0 0 0 0 0 0   SAB TAB Ectopic Multiple Live Births   0 0 0 0         Home Medications    Prior to Admission medications   Medication Sig Start Date End Date Taking? Authorizing Provider  acetaminophen (TYLENOL) 325 MG tablet Take 650 mg by mouth  every 6 (six) hours as needed for headache.    [provider]  baclofen (LIORESAL) 10 MG tablet Take 1 tablet (10 mg total) by mouth every 8 (eight) hours as needed for muscle spasms (Pain). 09/18/16   Magnus Sinning, MD  cyclobenzaprine (FLEXERIL) 5 MG tablet Take 1 tablet (5 mg total) by mouth 3 (three) times daily as needed for muscle spasms. 09/08/16   Lucretia Kern, DO  diphenhydrAMINE (BENADRYL) 25 MG tablet Take 25 mg by mouth daily.     [provider]  nabumetone (RELAFEN) 500 MG tablet Take 1 tablet (500 mg total) by mouth 2 (two) times daily. 09/18/16   Magnus Sinning, MD    Family History Family History  Problem Relation Age of Onset  . Cancer Mother        breast ca  . Diabetes Sister   . Cancer Maternal Aunt        colon  . Cancer Maternal Grandmother        uterine    Social History Social History  Substance Use Topics  . Smoking status: Former Smoker    Types: Cigarettes    Quit date: 12/06/2013  . Smokeless tobacco: Never Used     Comment: just quit recently  . Alcohol use No     Allergies   Patient has no known allergies.   Review of Systems Review of Systems  All  other systems reviewed and are negative.    Physical Exam Updated Vital Signs BP (!) 147/94 (BP Location: Left Arm)   Pulse 89   Temp 98.5 F (36.9 C) (Oral)   Resp 20   SpO2 100%   Physical Exam  Constitutional: She appears well-developed and well-nourished.  Patient appears uncomfortable but nontoxic  HENT:  Head: Atraumatic.  Eyes: Conjunctivae are normal.  Neck: Neck supple.  Cardiovascular: Normal rate and regular rhythm.   Pulmonary/Chest: Effort normal and breath sounds normal.  Abdominal: Soft. She exhibits no distension. There is no tenderness.  Musculoskeletal: She exhibits tenderness (Tenderness to lumbar spine and right lumbosacral region on palpation with full range of motion. Positive straight leg raise.).  Neurological: She is alert.  Patellar  deep tendon reflex intact bilaterally. 5/5 strength to bilateral lower extremity with intact distal pulses. Able to dorsiflex right great toe. Right leg compartment is soft.  Skin: No rash noted.  Psychiatric: She has a normal mood and affect.  Nursing note and vitals reviewed.    ED Treatments / Results  Labs (all labs ordered are listed, but only abnormal results are displayed) Labs Reviewed - No data to display  EKG  EKG Interpretation None       Radiology Mr Lumbar Spine Wo Contrast  Result Date: 09/27/2016 CLINICAL DATA:  Right leg radicular pain and numbness, progressing over the past few weeks. Fall a few months ago. EXAM: MRI LUMBAR SPINE WITHOUT CONTRAST TECHNIQUE: Multiplanar, multisequence MR imaging of the lumbar spine was performed. No intravenous contrast was administered. COMPARISON:  Lumbar spine radiographs 09/18/2016. CT abdomen and pelvis 07/09/2016. FINDINGS: The study is mildly motion degraded despite repeated imaging attempts. Segmentation: The lowest fully formed intervertebral disc space is designated L5-S1. Comparison studies demonstrate an assimilation joint between the left L5 transverse process and sacrum. Alignment: Trace anterolisthesis of L4 on L5 due to facet arthrosis. Vertebrae: No fracture or osseous lesion. Mild edema in the L5 pedicles may reflect a stress reaction. Conus medullaris: Extends to the L1 level and appears normal. Paraspinal and other soft tissues: Unremarkable. Disc levels: L1-2 and L2-3:  Negative. L3-4:  Mild facet arthrosis without disc herniation or stenosis. L4-5: Disc desiccation. Disc bulging, shallow central disc protrusion, mild ligamentum flavum thickening, and advanced facet arthrosis result in mild spinal stenosis, moderate bilateral lateral recess stenosis, and mild bilateral neural foraminal stenosis. The L5 nerve roots may be affected in the lateral recesses bilaterally. Additionally, there is a 5 mm low T1 and low T2 signal  intensity focus projecting anteriorly from the right facet joint at the L5 pedicle level which is likely also contributing to right L5 nerve root impingement as it courses into the neural foramen. L5-S1: Minimal disc bulging and mild-to-moderate facet arthrosis result in mild right neural foraminal stenosis without significant spinal stenosis. 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 Small low signal focus anterior to the right facet joint likely contributes to right L5 nerve root impingement, possibly a complex synovial cyst. A free disc fragment is a secondary consideration. Electronically Signed   By: Logan Bores M.D.   On: 09/27/2016 10:47    Procedures Procedures (including critical care time)  Medications Ordered in ED Medications  ondansetron (ZOFRAN) injection 4 mg (not administered)  dexamethasone (DECADRON) injection 10 mg (not administered)  ketorolac (TORADOL) 30 MG/ML injection 30 mg (30 mg Intravenous Given 09/27/16 0934)  morphine 4 MG/ML injection 6 mg (6 mg Intravenous Given 09/27/16  1103)     Initial Impression / Assessment and Plan / ED Course  I have reviewed the triage vital signs and the nursing notes.  Pertinent labs & imaging results that were available during my care of the patient were reviewed by me and considered in my medical decision making (see chart for details).     BP 137/75   Pulse 86   Temp 98.5 F (36.9 C) (Oral)   Resp 20   SpO2 100%    Final Clinical Impressions(s) / ED Diagnoses   Final diagnoses:  Radicular leg pain  Lumbar nerve root impingement    New Prescriptions New Prescriptions   METHYLPREDNISOLONE (MEDROL DOSEPAK) 4 MG TBPK TABLET    Follow package instruction   OXYCODONE-ACETAMINOPHEN (PERCOCET/ROXICET) 5-325 MG TABLET    Take 1 tablet by mouth every 4 (four) hours as needed for severe pain.   9:07 AM Patient here with radicular right leg pain. History of  sciatica in the past. She has a lumbar MRI scheduled for tomorrow but decided to come here due to worsening pain and tingling sensation to her R leg. No red flags. No saddle anesthesia to suggest cauda equina. However, since patient is scheduled to have an MRI tomorrow, I will obtain the MRI today to help expedite the process. Will provide pain management. Low suspicion for infectious etiology causing her symptoms.  10:58 AM Lumbar spine MRI demonstrates advanced L4/L5 arthrosis with slight antero-listhesis and bilateral lateral recess stenosis. Small low signal focus anterior to the right facet joint likely contribute to right L5 nerve root impingement, possibly a complex synovial cyst. A free disc fragment is a secondary consideration.  This finding is consistence with patient's presenting complaint. Due to the fact that she is having increased trouble ambulating, having trouble driving and having persistent tendon sensation down her toes, I will consult neurosurgeon for recommendation. Care discussed with Dr. Dolly Rias.  11:27 AM Appreciate consultation from neurosurgeon Dr. Windy Carina PA who recommend giving pt 10mg  of decadron and pt should be discharge with pain management, medrol dose pack and pt can f/u outpt with him next week.  Pt does not have any appreciable weakness at this time and no bowel/bladder dysfunction.  In order to decrease risk of narcotic abuse. Pt's record were checked using the [place] Controlled Substance database.     Domenic Moras, PA-C 09/27/16 1155    Domenic Moras, PA-C 09/27/16 1158    Mesner, Corene Cornea, MD 09/27/16 [phone]

## 2016-09-28 ENCOUNTER — Other Ambulatory Visit: Payer: PRIVATE HEALTH INSURANCE

## 2016-09-30 ENCOUNTER — Telehealth: Payer: Self-pay | Admitting: Family Medicine

## 2016-09-30 ENCOUNTER — Telehealth (INDEPENDENT_AMBULATORY_CARE_PROVIDER_SITE_OTHER): Payer: Self-pay | Admitting: Radiology

## 2016-09-30 MED ORDER — GABAPENTIN 300 MG PO CAPS: ORAL_CAPSULE | ORAL | 0 refills | Status: DC

## 2016-09-30 MED ORDER — HYDROCODONE-ACETAMINOPHEN 5-325 MG PO TABS: 1.0000 | ORAL_TABLET | Freq: Four times a day (QID) | ORAL | 0 refills | Status: DC | PRN

## 2016-09-30 NOTE — Telephone Encounter (Signed)
Patient is scheduled to see Dr. Saintclair Halsted on 10/15/16, which is also the date of your next available appointment. I advised her to call and let them know that his PA told her they would see her next week. She said they looked to see if another surgeon could see her sooner, but Dr. Saintclair Halsted wants to see her. She wants to hold off on the injection for now, I advised that we would prescribe Norco and Gabapentin. [place].

## 2016-09-30 NOTE — Telephone Encounter (Signed)
I am glad she finally saw the specialist as we recommended and had imaging. I am not sure of the request from this message? Can you check?

## 2016-09-30 NOTE — Telephone Encounter (Signed)
Please advise. Looks like you ordered an MRI when you saw her 6/14. It was scheduled for 6/24, but she went to ED 6/23 and had one done that day. ED referred to Dr. Saintclair Halsted. Please advise.

## 2016-09-30 NOTE — Telephone Encounter (Signed)
The ED note said Dr. Windy Carina PA said she could follow up with them next week?  The note was from the 24th, when is her appointment?  She needs to tell NSGY that she was seen by Dr. Windy Carina PA in ED. We could see if Yates/Nitka quicker depending on when she is seeing Saintclair Halsted.  We can do L5 TF injection for pain with valium pre-procedure. We can see give her short term norco and should start Gabapentin. Talk to her and let me know and we can get medications and injection going.

## 2016-09-30 NOTE — Telephone Encounter (Signed)
Pt. Did receive 15 tablets of oxycodone on 23rd from ED. I have printed rx for norco for 7 day supply, needs to last 7 days.  Also started gabapentin.

## 2016-09-30 NOTE — Telephone Encounter (Signed)
Pt went to ED for her back pain and states they found a cyst on her spine. Pt just wanted to let you know. She is currently seeking a sooner appt through the ortho you referred her to for a neurosurgeon

## 2016-09-30 NOTE — Telephone Encounter (Signed)
Patient notified that rx is at our front desk for pick up.

## 2016-09-30 NOTE — Telephone Encounter (Signed)
Patient called, said that since she last saw Dr Ernestina Patches she went to the ED again and the lumbar MRI has been done.  They told her she has a cyst on her spine and referred to a NSU.  That NSU is booked out and cannot see her soon.  She wants to know if Dr Ernestina Patches can refer her to another one that can see her sooner?  She also says she is in severe pain and cannot walk, she needs some pain meds.  Please call her, thanks.

## 2016-10-01 NOTE — Telephone Encounter (Signed)
Pt not requesting anything form this call.  Just wanted you to know what they found.  Pt states she will keep in touch. She is requesting pain meds from her orthopedic.

## 2016-10-02 NOTE — Telephone Encounter (Signed)
Noted  

## 2016-10-13 ENCOUNTER — Emergency Department (HOSPITAL_COMMUNITY)
Admission: EM | Admit: 2016-10-13 | Discharge: 2016-10-13 | Disposition: A | Discharge status: Home / Self Care | Payer: PRIVATE HEALTH INSURANCE | Attending: Emergency Medicine | Admitting: Emergency Medicine

## 2016-10-13 ENCOUNTER — Encounter (HOSPITAL_COMMUNITY): Payer: Self-pay | Admitting: Emergency Medicine

## 2016-10-13 DIAGNOSIS — M5441 Lumbago with sciatica, right side: Secondary | ICD-10-CM | POA: Diagnosis present

## 2016-10-13 DIAGNOSIS — G8929 Other chronic pain: Secondary | ICD-10-CM

## 2016-10-13 DIAGNOSIS — Z87891 Personal history of nicotine dependence: Secondary | ICD-10-CM | POA: Diagnosis not present

## 2016-10-13 LAB — CBC WITH DIFFERENTIAL/PLATELET
BASOS PCT: 0 %
Basophils Absolute: 0 10*3/uL (ref 0.0–0.1)
EOS ABS: 0.1 10*3/uL (ref 0.0–0.7)
Eosinophils Relative: 1 %
HCT: 31.5 % — ABNORMAL LOW (ref 36.0–46.0)
HEMOGLOBIN: 9.1 g/dL — AB (ref 12.0–15.0)
Lymphocytes Relative: 32 %
Lymphs Abs: 3.6 10*3/uL (ref 0.7–4.0)
MCH: 18.8 pg — AB (ref 26.0–34.0)
MCHC: 28.9 g/dL — AB (ref 30.0–36.0)
MCV: 65.2 fL — ABNORMAL LOW (ref 78.0–100.0)
MONO ABS: 0.8 10*3/uL (ref 0.1–1.0)
Monocytes Relative: 7 %
NEUTROS ABS: 6.9 10*3/uL (ref 1.7–7.7)
Neutrophils Relative %: 60 %
PLATELETS: 237 10*3/uL (ref 150–400)
RBC: 4.83 MIL/uL (ref 3.87–5.11)
RDW: 21.8 % — AB (ref 11.5–15.5)
WBC: 11.4 10*3/uL — ABNORMAL HIGH (ref 4.0–10.5)

## 2016-10-13 LAB — BASIC METABOLIC PANEL
Anion gap: 9 (ref 5–15)
BUN: 7 mg/dL (ref 6–20)
CALCIUM: 8.8 mg/dL — AB (ref 8.9–10.3)
CHLORIDE: 107 mmol/L (ref 101–111)
CO2: 20 mmol/L — ABNORMAL LOW (ref 22–32)
CREATININE: 0.6 mg/dL (ref 0.44–1.00)
GFR calc Af Amer: >60 mL/min (ref >60)
GFR calc non Af Amer: >60 mL/min (ref >60)
Glucose, Bld: 91 mg/dL (ref 65–99)
Potassium: 3.6 mmol/L (ref 3.5–5.1)
SODIUM: 136 mmol/L (ref 135–145)

## 2016-10-13 LAB — URINALYSIS, ROUTINE W REFLEX MICROSCOPIC
Bacteria, UA: NONE SEEN
Bilirubin Urine: NEGATIVE
GLUCOSE, UA: NEGATIVE mg/dL
Hgb urine dipstick: NEGATIVE
Ketones, ur: NEGATIVE mg/dL
Nitrite: NEGATIVE
PROTEIN: NEGATIVE mg/dL
Specific Gravity, Urine: 1.039 — ABNORMAL HIGH (ref 1.005–1.030)
pH: 5 (ref 5.0–8.0)

## 2016-10-13 LAB — POC URINE PREG, ED: PREG TEST UR: NEGATIVE

## 2016-10-13 MED ORDER — LIDOCAINE 5 % EX PTCH
1.0000 | MEDICATED_PATCH | CUTANEOUS | Status: DC
  Administered 2016-10-13: 1 via TRANSDERMAL
  Filled 2016-10-13: qty 1

## 2016-10-13 MED ORDER — METHOCARBAMOL 500 MG PO TABS
750.0000 mg | ORAL_TABLET | Freq: Once | ORAL | Status: AC
  Administered 2016-10-13: 750 mg via ORAL
  Filled 2016-10-13: qty 2

## 2016-10-13 MED ORDER — OXYCODONE-ACETAMINOPHEN 5-325 MG PO TABS
1.0000 | ORAL_TABLET | Freq: Once | ORAL | Status: AC
  Administered 2016-10-13: 1 via ORAL

## 2016-10-13 MED ORDER — OXYCODONE-ACETAMINOPHEN 5-325 MG PO TABS
1.0000 | ORAL_TABLET | Freq: Once | ORAL | Status: AC
  Administered 2016-10-13: 1 via ORAL
  Filled 2016-10-13: qty 1

## 2016-10-13 MED ORDER — OXYCODONE-ACETAMINOPHEN 5-325 MG PO TABS
ORAL_TABLET | ORAL | Status: AC
  Filled 2016-10-13: qty 1

## 2016-10-13 MED ORDER — BACLOFEN 10 MG PO TABS: 10.0000 mg | ORAL_TABLET | Freq: Three times a day (TID) | ORAL | 0 refills | Status: AC | PRN

## 2016-10-13 MED ORDER — METHYLPREDNISOLONE 4 MG PO TBPK: ORAL_TABLET | ORAL | 0 refills | Status: DC

## 2016-10-13 MED ORDER — LIDOCAINE 5 % EX PTCH: 1.0000 | MEDICATED_PATCH | CUTANEOUS | 0 refills | Status: AC

## 2016-10-13 MED ORDER — DEXAMETHASONE SODIUM PHOSPHATE 10 MG/ML IJ SOLN
10.0000 mg | Freq: Once | INTRAMUSCULAR | Status: AC
  Administered 2016-10-13: 10 mg via INTRAMUSCULAR
  Filled 2016-10-13: qty 1

## 2016-10-13 NOTE — Discharge Instructions (Signed)
Please use baclofen (muscle relaxer), lidocaine patch and medrol pack as prescribed.   With any chronic pain, we are required to limit prescribing narcotic pain medications in the emergency department and ask that you contact your outpatient medical providers for refill on any narcotic pain prescriptions.  The [place] narcotic database shows you were given 40 pills of oxycodone on 10/07/16. Please contact your neurosurgeon or primary care provider for any refills needed for oxycodone or percocet.   Call your neurosurgeon as soon as possible to discuss worsening pain, he may be able to see you in office or expedite surgery

## 2016-10-13 NOTE — ED Provider Notes (Signed)
[facility] DEPT Provider Note   CSN: [id number] Arrival date & time: 10/13/16  0554     History   Chief Complaint Chief Complaint  Patient presents with  . Back Pain    HPI [name] is a 46 y.o. female with history of chronic back pain, L4-5 disc bulging and cyst presents to ED for evaluation of acute on chronic back pain x 24 hours. Pain is constant, 10/10, originates in right buttock and radiates to RLE. Movement, palpation, sitting and laying flat exacerbates the pain. Percocet temporarily relieves pain, ran out 24 hours ago. Ran out of muscle relaxers days ago. No falls or trauma to back. No numbness or weakness to lower extremities. No saddle anesthesia, b/b incontinence or retention, urinary symptoms or abnormal vaginal bleeding. Patient has [facility] surgery scheduled for 10/23/16 (Dr. Saintclair Halsted).  HPI  Past Medical History:  Diagnosis Date  . Allergy   . Frequent headaches   . GERD (gastroesophageal reflux disease)   . Migraines   . Urinary tract infection     There are no active problems to display for this patient.   Past Surgical History:  Procedure Laterality Date  . TUBAL LIGATION      OB History    Gravida Para Term Preterm AB Living   0 0 0 0 0 0   SAB TAB Ectopic Multiple Live Births   0 0 0 0         Home Medications    Prior to Admission medications   Medication Sig Start Date End Date Taking? Authorizing Provider  acetaminophen (TYLENOL) 325 MG tablet Take 650 mg by mouth every 6 (six) hours as needed for headache.    [provider]  baclofen (LIORESAL) 10 MG tablet Take 1 tablet (10 mg total) by mouth every 8 (eight) hours as needed for muscle spasms (Pain). 10/13/16 10/20/16  Kinnie Feil, PA-C  cyclobenzaprine (FLEXERIL) 5 MG tablet Take 1 tablet (5 mg total) by mouth 3 (three) times daily as needed for muscle spasms. Patient not taking: Reported on 09/27/2016 09/08/16   Lucretia Kern, DO  diphenhydrAMINE (BENADRYL) 25 MG tablet  Take 25 mg by mouth daily.     [provider]  gabapentin (NEURONTIN) 300 MG capsule Take 1 capsule by mouth at night for 7 nights and then 1 in the morning and one at night for 7 days and then 3 times a day. 09/30/16   Magnus Sinning, MD  HYDROcodone-acetaminophen (NORCO/VICODIN) 5-325 MG tablet Take 1 tablet by mouth every 6 (six) hours as needed for moderate pain. 09/30/16   Magnus Sinning, MD  lidocaine (LIDODERM) 5 % Place 1 patch onto the skin daily. Apply to right lower back. Change every 24 hours. 10/13/16 10/18/16  Kinnie Feil, PA-C  methylPREDNISolone (MEDROL DOSEPAK) 4 MG TBPK tablet Follow package instruction 10/13/16   Kinnie Feil, PA-C  nabumetone (RELAFEN) 500 MG tablet Take 1 tablet (500 mg total) by mouth 2 (two) times daily. 09/18/16   Magnus Sinning, MD  oxyCODONE-acetaminophen (PERCOCET/ROXICET) 5-325 MG tablet Take 1 tablet by mouth every 4 (four) hours as needed for severe pain. 09/27/16   Domenic Moras, PA-C    Family History Family History  Problem Relation Age of Onset  . Cancer Mother        breast ca  . Diabetes Sister   . Cancer Maternal Aunt        colon  . Cancer Maternal Grandmother  uterine    Social History Social History  Substance Use Topics  . Smoking status: Former Smoker    Types: Cigarettes    Quit date: 12/06/2013  . Smokeless tobacco: Never Used     Comment: just quit recently  . Alcohol use No     Allergies   Patient has no known allergies.   Review of Systems Review of Systems  Constitutional: Negative for fever.  Respiratory: Negative for cough and shortness of breath.   Cardiovascular: Negative for chest pain.  Gastrointestinal: Negative for abdominal pain, constipation, diarrhea, nausea and vomiting.  Genitourinary: Negative for decreased urine volume, difficulty urinating, dysuria, frequency, hematuria and vaginal bleeding.  Musculoskeletal: Positive for back pain and myalgias.  Skin: Negative for rash.   Neurological: Negative for weakness and numbness.     Physical Exam Updated Vital Signs BP (!) 153/79 (BP Location: Right Arm)   Pulse 87   Temp 98.3 F (36.8 C) (Oral)   Resp 16   Ht 5\' 2"  (1.575 m)   Wt 84.8 kg (187 lb)   SpO2 100%   BMI 34.20 kg/m   Physical Exam  Constitutional: She is oriented to person, place, and time. She appears well-developed and well-nourished.  HENT:  Head: Normocephalic and atraumatic.  Right Ear: External ear normal.  Left Ear: External ear normal.  Nose: Nose normal.  Eyes: Conjunctivae and EOM are normal. No scleral icterus.  Neck: Normal range of motion. Neck supple.  Cardiovascular: Normal rate, regular rhythm and normal heart sounds.   No murmur heard. Pulmonary/Chest: Effort normal and breath sounds normal. She has no wheezes.  Abdominal: Soft. There is no tenderness.  Musculoskeletal: Normal range of motion. She exhibits no deformity.  Midline lumbar spine tenderness R SI joint and sciatic notch tenderness Positive SLR and Faber (right) Full PROM of LE  Neurological: She is alert and oriented to person, place, and time.  5/5 strength with flexion and extension or hip, knee and ankles.  Sensation to light touch intact lower extremities   Skin: Skin is warm and dry. Capillary refill takes less than 2 seconds.  No rash over lower back   Psychiatric: She has a normal mood and affect. Her behavior is normal. Judgment and thought content normal.  Nursing note and vitals reviewed.    ED Treatments / Results  Labs (all labs ordered are listed, but only abnormal results are displayed) Labs Reviewed  CBC WITH DIFFERENTIAL/PLATELET - Abnormal; Notable for the following:       Result Value   WBC 11.4 (*)    Hemoglobin 9.1 (*)    HCT 31.5 (*)    MCV 65.2 (*)    MCH 18.8 (*)    MCHC 28.9 (*)    RDW 21.8 (*)    All other components within normal limits  BASIC METABOLIC PANEL - Abnormal; Notable for the following:    CO2 20 (*)     Calcium 8.8 (*)    All other components within normal limits  URINALYSIS, ROUTINE W REFLEX MICROSCOPIC - Abnormal; Notable for the following:    APPearance HAZY (*)    Specific Gravity, Urine 1.039 (*)    Leukocytes, UA TRACE (*)    Squamous Epithelial / LPF 0-5 (*)    All other components within normal limits  POC URINE PREG, ED    EKG  EKG Interpretation None       Radiology No results found.  Procedures Procedures (including critical care time)  Medications Ordered in  ED Medications  lidocaine (LIDODERM) 5 % 1 patch (not administered)  oxyCODONE-acetaminophen (PERCOCET/ROXICET) 5-325 MG per tablet 1 tablet (1 tablet Oral Given 10/13/16 0606)  dexamethasone (DECADRON) injection 10 mg (10 mg Intramuscular Given 10/13/16 0931)  methocarbamol (ROBAXIN) tablet 750 mg (750 mg Oral Given 10/13/16 0930)  oxyCODONE-acetaminophen (PERCOCET/ROXICET) 5-325 MG per tablet 1 tablet (1 tablet Oral Given 10/13/16 1032)     Initial Impression / Assessment and Plan / ED Course  I have reviewed the triage vital signs and the nursing notes.  Pertinent labs & imaging results that were available during my care of the patient were reviewed by me and considered in my medical decision making (see chart for details).    46 year old female with history of chronic back pain presents to the ED for evaluation of acute on chronic back pain for the past 24 hours. At home she has gabapentin, Percocet, muscle relaxers that she has been prescribed by neurosurgeon. She ran out of Percocet 24 hours ago. Pain is typical to her chronic back pain. No new falls or injury. No saddle anesthesia, focal numbness or weakness to extremities, urinary symptoms, fevers. No IV drug use. Exam is reassuring today. No red flag symptoms of back pain including: bladder/bowel incontinence or retention, night sweats, night pain, fevers or weight loss, h/o cancer, IVDU, recent trauma or falls. Imaging not indicated today as physical exam  reassuring, she had an MRI on 09/2011 and has scheduled neurosurgery (Dr. Saintclair Halsted) on 10/23/16. I reviewed [place] narcotic database, she was given 40 pills of oxycodone on 6 days ago, she should have 3 days of oxycodone remaining however she reports running out 24 hours ago. Will discharge with Medrol dose pack, muscle relaxers and lidocaine patch. Will avoid refill of narcotic pain medicines today. Encouraged she contact PCP or outpatient providers for long term management of pain.   Final Clinical Impressions(s) / ED Diagnoses   Final diagnoses:  Chronic right-sided low back pain with right-sided sciatica    New Prescriptions New Prescriptions   LIDOCAINE (LIDODERM) 5 %    Place 1 patch onto the skin daily. Apply to right lower back. Change every 24 hours.     Kinnie Feil, PA-C 10/13/16 1046    Gareth Morgan, MD 10/15/16 1301

## 2016-10-13 NOTE — ED Triage Notes (Signed)
Patient arrived with EMS from home reports chronic low back pain for 3 months worse this week unrelieved by prescription pain medications , denies injury , scheduled for surgery on 7/19 removal of cyst in the spine .

## 2016-10-16 ENCOUNTER — Other Ambulatory Visit: Payer: Self-pay | Admitting: Student

## 2016-10-16 DIAGNOSIS — M5126 Other intervertebral disc displacement, lumbar region: Secondary | ICD-10-CM

## 2016-10-17 ENCOUNTER — Other Ambulatory Visit: Payer: Self-pay | Admitting: Student

## 2016-10-17 ENCOUNTER — Ambulatory Visit
Admission: RE | Admit: 2016-10-17 | Discharge: 2016-10-17 | Disposition: A | Discharge status: Home / Self Care | Payer: Medicaid Other | Source: Ambulatory Visit | Attending: Student | Admitting: Student

## 2016-10-17 DIAGNOSIS — M5126 Other intervertebral disc displacement, lumbar region: Secondary | ICD-10-CM

## 2016-10-17 MED ORDER — METHYLPREDNISOLONE ACETATE 40 MG/ML INJ SUSP (RADIOLOG
120.0000 mg | Freq: Once | INTRAMUSCULAR | Status: AC
  Administered 2016-10-17: 120 mg via EPIDURAL

## 2016-10-17 MED ORDER — IOPAMIDOL (ISOVUE-M 200) INJECTION 41%
1.0000 mL | Freq: Once | INTRAMUSCULAR | Status: AC
  Administered 2016-10-17: 1 mL via EPIDURAL

## 2016-10-17 NOTE — Discharge Instructions (Signed)

## 2016-10-23 ENCOUNTER — Other Ambulatory Visit: Payer: Self-pay | Admitting: Neurosurgery

## 2016-10-27 ENCOUNTER — Encounter: Payer: Self-pay | Admitting: Vascular Surgery

## 2016-10-29 ENCOUNTER — Other Ambulatory Visit: Payer: Self-pay

## 2016-10-29 DIAGNOSIS — I8393 Asymptomatic varicose veins of bilateral lower extremities: Secondary | ICD-10-CM

## 2016-11-06 ENCOUNTER — Encounter (HOSPITAL_COMMUNITY): Payer: PRIVATE HEALTH INSURANCE

## 2016-11-06 ENCOUNTER — Encounter: Payer: PRIVATE HEALTH INSURANCE | Admitting: Vascular Surgery

## 2016-11-12 ENCOUNTER — Other Ambulatory Visit (HOSPITAL_COMMUNITY): Payer: Self-pay | Admitting: *Deleted

## 2016-11-12 NOTE — Pre-Procedure Instructions (Signed)
    RENNIE HACK  11/12/2016      [place] [place] - [address] [place] [address] Phone: [phone] Fax: [phone]  [facility] [place] - [place], [place] [address] [place] [address] Phone: [phone] Fax: [phone]   Your procedure is scheduled on Wednesday, November 19, 2016 at 8:30 AM.   Report to [facility] Entrance "A" Admitting Office at 6:30 AM.   Call this number if you have problems the morning of surgery: [phone]   Questions prior to day of surgery, please call [phone] between 8 & 4 PM.   Remember:  Do not eat food or drink liquids after midnight Tuesday, 11/18/16.  Take these medicines the morning of surgery with A SIP OF WATER: Gabapentin (Neurontin), Tizanidine (Zanaflex) - if needed, Tylenol - if needed  Stop Aspirin products (BC Fast Relief Arthritis) as of today. Do not use NSAIDS (Ibuprofen, Aleve, etc) prior to surgery.   Do not wear jewelry, make-up or nail polish.  Do not wear lotions, powders, perfumes or deodorant.  Do not shave 48 hours prior to surgery.    Do not bring valuables to the hospital.  [facility] is not responsible for any belongings or valuables.  Contacts, dentures or bridgework may not be worn into surgery.  Leave your suitcase in the car.  After surgery it may be brought to your room.  For patients admitted to the hospital, discharge time will be determined by your treatment team.  See "Preparing for Surgery" Instruction sheet.   Please read over the fact sheets that you were given.

## 2016-11-13 ENCOUNTER — Encounter (HOSPITAL_COMMUNITY)
Admission: RE | Admit: 2016-11-13 | Discharge: 2016-11-13 | Disposition: A | Discharge status: Home / Self Care | Payer: PRIVATE HEALTH INSURANCE | Source: Ambulatory Visit | Attending: Neurosurgery | Admitting: Neurosurgery

## 2016-11-13 ENCOUNTER — Encounter (HOSPITAL_COMMUNITY): Payer: Self-pay

## 2016-11-13 DIAGNOSIS — M549 Dorsalgia, unspecified: Secondary | ICD-10-CM | POA: Insufficient documentation

## 2016-11-13 DIAGNOSIS — Z9851 Tubal ligation status: Secondary | ICD-10-CM | POA: Diagnosis not present

## 2016-11-13 DIAGNOSIS — Z87891 Personal history of nicotine dependence: Secondary | ICD-10-CM | POA: Insufficient documentation

## 2016-11-13 DIAGNOSIS — Z79899 Other long term (current) drug therapy: Secondary | ICD-10-CM | POA: Insufficient documentation

## 2016-11-13 DIAGNOSIS — G8929 Other chronic pain: Secondary | ICD-10-CM | POA: Insufficient documentation

## 2016-11-13 DIAGNOSIS — Z01812 Encounter for preprocedural laboratory examination: Secondary | ICD-10-CM | POA: Insufficient documentation

## 2016-11-13 DIAGNOSIS — Z79891 Long term (current) use of opiate analgesic: Secondary | ICD-10-CM | POA: Diagnosis not present

## 2016-11-13 DIAGNOSIS — K219 Gastro-esophageal reflux disease without esophagitis: Secondary | ICD-10-CM | POA: Insufficient documentation

## 2016-11-13 HISTORY — DX: Unspecified osteoarthritis, unspecified site: M19.90

## 2016-11-13 LAB — BASIC METABOLIC PANEL
ANION GAP: 7 (ref 5–15)
BUN: 5 mg/dL — ABNORMAL LOW (ref 6–20)
CALCIUM: 9.2 mg/dL (ref 8.9–10.3)
CO2: 24 mmol/L (ref 22–32)
Chloride: 108 mmol/L (ref 101–111)
Creatinine, Ser: 0.69 mg/dL (ref 0.44–1.00)
GFR calc non Af Amer: >60 mL/min (ref >60)
Glucose, Bld: 93 mg/dL (ref 65–99)
Potassium: 4 mmol/L (ref 3.5–5.1)
Sodium: 139 mmol/L (ref 135–145)

## 2016-11-13 LAB — CBC
HEMATOCRIT: 30.9 % — AB (ref 36.0–46.0)
HEMOGLOBIN: 9.1 g/dL — AB (ref 12.0–15.0)
MCH: 20.3 pg — ABNORMAL LOW (ref 26.0–34.0)
MCHC: 29.4 g/dL — ABNORMAL LOW (ref 30.0–36.0)
MCV: 69 fL — ABNORMAL LOW (ref 78.0–100.0)
Platelets: 169 10*3/uL (ref 150–400)
RBC: 4.48 MIL/uL (ref 3.87–5.11)
RDW: 24.5 % — AB (ref 11.5–15.5)
WBC: 7.6 10*3/uL (ref 4.0–10.5)

## 2016-11-13 LAB — SURGICAL PCR SCREEN
MRSA, PCR: NEGATIVE
STAPHYLOCOCCUS AUREUS: POSITIVE — AB

## 2016-11-13 LAB — HCG, SERUM, QUALITATIVE: PREG SERUM: NEGATIVE

## 2016-11-13 NOTE — Progress Notes (Signed)
PCP- DR. HANNAH  KIM  [place])

## 2016-11-14 NOTE — Progress Notes (Addendum)
Anesthesia Chart Review:  Pt is a 45 year old female scheduled for L4-5 microdiscectomy on 11/19/2016 with Kary Kos, MD  - PCP is Colin Benton, DO  PMH includes:  Fibroids, GERD. Former smoker. BMI 32.5  Medications reviewed.  BP 115/73   Pulse 100   Temp 36.8 C   Resp 20   Ht 5\' 1"  (1.549 m)   Wt 172 lb 3.2 oz (78.1 kg)   LMP 11/13/2016   SpO2 95%   BMI 32.54 kg/m    Preoperative labs reviewed. H/H 9.1/30.9.  Hgb has ranged 8.8-9.1 over last ~4 months.  - I notified PCP of anemia.   CXR 07/09/16: No active cardiopulmonary disease.  Slight hyperaeration.  Reviewed case with Dr. Conrad Swanton.   If no changes, I anticipate pt can proceed with surgery as scheduled.   Willeen Cass, FNP-BC [facility]/Anesthesiology Phone: [phone] 11/14/2016 4:37 PM

## 2016-11-16 NOTE — Progress Notes (Deleted)
HPI:  [name] is a pleasat 46 yo with a PMH significant fo chronic low back pain (seeing [facility] and considering surgery), GERD, migraines, seasonal allergies, fibroids with menorrhagia and anemia here for follow up. Advised gyn management of her menorrhagia in the past.. She has a hx of noncompliance and rare visits here. Due for pap smear, flu shot, hiv screening.  ROS: See pertinent positives and negatives per HPI.  Past Medical History:  Diagnosis Date  . Allergy   . Arthritis   . Frequent headaches   . GERD (gastroesophageal reflux disease)   . Migraines   . Urinary tract infection     Past Surgical History:  Procedure Laterality Date  . TUBAL LIGATION      Family History  Problem Relation Age of Onset  . Cancer Mother        breast ca  . Diabetes Sister   . Cancer Maternal Aunt        colon  . Cancer Maternal Grandmother        uterine    Social History   Social History  . Marital status: Single    Spouse name: N/A  . Number of children: N/A  . Years of education: N/A   Social History Main Topics  . Smoking status: Former Smoker    Types: Cigarettes    Quit date: 12/06/2013  . Smokeless tobacco: Never Used     Comment: just quit recently  . Alcohol use No  . Drug use: No  . Sexual activity: Not on file   Other Topics Concern  . Not on file   Social History Narrative   Work or School: CNA, [employer], doing human associates degree      Home Situation: lives with her two daughters and her son (has six children)      Spiritual Beliefs: Christian      Lifestyle:  No regular CV exercise; diet is so so              Current Outpatient Prescriptions:  .  acetaminophen (TYLENOL) 325 MG tablet, Take 650 mg by mouth every 6 (six) hours as needed for headache., Disp: , Rfl:  .  Aspirin-Caffeine (BC FAST PAIN RELIEF ARTHRITIS PO), Take 1 packet by mouth 2 (two) times daily as needed (pain)., Disp: , Rfl:  .  cyclobenzaprine  (FLEXERIL) 5 MG tablet, Take 1 tablet (5 mg total) by mouth 3 (three) times daily as needed for muscle spasms. (Patient not taking: Reported on 11/11/2016), Disp: 30 tablet, Rfl: 1 .  diphenhydrAMINE (BENADRYL) 25 MG tablet, Take 25 mg by mouth daily as needed for allergies. , Disp: , Rfl:  .  gabapentin (NEURONTIN) 300 MG capsule, Take 1 capsule by mouth at night for 7 nights and then 1 in the morning and one at night for 7 days and then 3 times a day. (Patient taking differently: Take 300 mg by mouth 3 (three) times daily. Take 1 capsule by mouth at night for 7 nights and then 1 in the morning and one at night for 7 days and then 3 times a day.), Disp: 90 capsule, Rfl: 0 .  HYDROcodone-acetaminophen (NORCO/VICODIN) 5-325 MG tablet, Take 1 tablet by mouth every 6 (six) hours as needed for moderate pain. (Patient not taking: Reported on 11/11/2016), Disp: 28 tablet, Rfl: 0 .  methylPREDNISolone (MEDROL DOSEPAK) 4 MG TBPK tablet, Follow package instruction (Patient not taking: Reported on 11/11/2016), Disp: 21 tablet, Rfl: 0 .  nabumetone (RELAFEN) 500 MG tablet, Take 1 tablet (500 mg total) by mouth 2 (two) times daily. (Patient not taking: Reported on 11/11/2016), Disp: 60 tablet, Rfl: 0 .  oxyCODONE-acetaminophen (PERCOCET/ROXICET) 5-325 MG tablet, Take 1 tablet by mouth every 4 (four) hours as needed for severe pain. (Patient not taking: Reported on 11/11/2016), Disp: 15 tablet, Rfl: 0 .  tiZANidine (ZANAFLEX) 2 MG tablet, Take 2 mg by mouth every 6 (six) hours as needed for muscle spasms., Disp: , Rfl:   EXAM:  There were no vitals filed for this visit.  There is no height or weight on file to calculate BMI.  GENERAL: vitals reviewed and listed above, alert, oriented, appears well hydrated and in no acute distress  HEENT: atraumatic, conjunttiva clear, no obvious abnormalities on inspection of external nose and ears  NECK: no obvious masses on inspection  LUNGS: clear to auscultation bilaterally, no  wheezes, rales or rhonchi, good air movement  CV: HRRR, no peripheral edema  MS: moves all extremities without noticeable abnormality  PSYCH: pleasant and cooperative, no obvious depression or anxiety  ASSESSMENT AND PLAN:  Discussed the following assessment and plan:  No diagnosis found.  -Patient advised to return or notify a doctor immediately if symptoms worsen or persist or new concerns arise.  There are no Patient Instructions on file for this visit.  Colin Benton R., DO

## 2016-11-17 ENCOUNTER — Ambulatory Visit: Payer: PRIVATE HEALTH INSURANCE | Admitting: Family Medicine

## 2016-11-17 DIAGNOSIS — Z0289 Encounter for other administrative examinations: Secondary | ICD-10-CM

## 2016-11-19 ENCOUNTER — Ambulatory Visit (HOSPITAL_COMMUNITY): Payer: Medicaid Other | Admitting: Emergency Medicine

## 2016-11-19 ENCOUNTER — Observation Stay (HOSPITAL_COMMUNITY)
Admission: RE | Admit: 2016-11-19 | Discharge: 2016-11-19 | Disposition: A | Discharge status: Home / Self Care | Payer: Medicaid Other | Source: Ambulatory Visit | Attending: Neurosurgery | Admitting: Neurosurgery

## 2016-11-19 ENCOUNTER — Ambulatory Visit (HOSPITAL_COMMUNITY): Payer: Medicaid Other

## 2016-11-19 ENCOUNTER — Ambulatory Visit (HOSPITAL_COMMUNITY): Payer: Medicaid Other | Admitting: Anesthesiology

## 2016-11-19 ENCOUNTER — Encounter (HOSPITAL_COMMUNITY)
Admission: RE | Disposition: A | Discharge status: Home / Self Care | Payer: Self-pay | Source: Ambulatory Visit | Attending: Neurosurgery

## 2016-11-19 DIAGNOSIS — M1990 Unspecified osteoarthritis, unspecified site: Secondary | ICD-10-CM | POA: Insufficient documentation

## 2016-11-19 DIAGNOSIS — G629 Polyneuropathy, unspecified: Secondary | ICD-10-CM | POA: Insufficient documentation

## 2016-11-19 DIAGNOSIS — M5116 Intervertebral disc disorders with radiculopathy, lumbar region: Principal | ICD-10-CM | POA: Insufficient documentation

## 2016-11-19 DIAGNOSIS — Z87891 Personal history of nicotine dependence: Secondary | ICD-10-CM | POA: Insufficient documentation

## 2016-11-19 DIAGNOSIS — M5126 Other intervertebral disc displacement, lumbar region: Secondary | ICD-10-CM | POA: Diagnosis present

## 2016-11-19 DIAGNOSIS — Z79899 Other long term (current) drug therapy: Secondary | ICD-10-CM | POA: Insufficient documentation

## 2016-11-19 DIAGNOSIS — Z87440 Personal history of urinary (tract) infections: Secondary | ICD-10-CM | POA: Insufficient documentation

## 2016-11-19 DIAGNOSIS — K219 Gastro-esophageal reflux disease without esophagitis: Secondary | ICD-10-CM | POA: Diagnosis not present

## 2016-11-19 DIAGNOSIS — Z7982 Long term (current) use of aspirin: Secondary | ICD-10-CM | POA: Diagnosis not present

## 2016-11-19 DIAGNOSIS — M7138 Other bursal cyst, other site: Secondary | ICD-10-CM | POA: Diagnosis not present

## 2016-11-19 DIAGNOSIS — G43909 Migraine, unspecified, not intractable, without status migrainosus: Secondary | ICD-10-CM | POA: Insufficient documentation

## 2016-11-19 DIAGNOSIS — Z419 Encounter for procedure for purposes other than remedying health state, unspecified: Secondary | ICD-10-CM

## 2016-11-19 HISTORY — PX: LUMBAR LAMINECTOMY/DECOMPRESSION MICRODISCECTOMY: SHX5026

## 2016-11-19 SURGERY — LUMBAR LAMINECTOMY/DECOMPRESSION MICRODISCECTOMY 1 LEVEL
Anesthesia: General | Site: Spine Lumbar | Laterality: Right

## 2016-11-19 MED ORDER — CEFAZOLIN SODIUM-DEXTROSE 2-3 GM-% IV SOLR
INTRAVENOUS | Status: DC | PRN
  Administered 2016-11-19: 2 g via INTRAVENOUS

## 2016-11-19 MED ORDER — CEFAZOLIN SODIUM-DEXTROSE 2-4 GM/100ML-% IV SOLN
INTRAVENOUS | Status: AC
  Filled 2016-11-19: qty 100

## 2016-11-19 MED ORDER — CYCLOBENZAPRINE HCL 10 MG PO TABS
10.0000 mg | ORAL_TABLET | Freq: Three times a day (TID) | ORAL | Status: DC | PRN
  Administered 2016-11-19: 10 mg via ORAL
  Filled 2016-11-19: qty 1

## 2016-11-19 MED ORDER — HYDROCODONE-ACETAMINOPHEN 5-325 MG PO TABS
1.0000 | ORAL_TABLET | ORAL | Status: DC | PRN
  Administered 2016-11-19 (×2): 2 via ORAL
  Filled 2016-11-19 (×2): qty 2

## 2016-11-19 MED ORDER — ONDANSETRON HCL 4 MG/2ML IJ SOLN
INTRAMUSCULAR | Status: DC | PRN
  Administered 2016-11-19: 4 mg via INTRAVENOUS

## 2016-11-19 MED ORDER — CEFAZOLIN SODIUM-DEXTROSE 2-4 GM/100ML-% IV SOLN
2.0000 g | Freq: Three times a day (TID) | INTRAVENOUS | Status: DC
  Administered 2016-11-19: 2 g via INTRAVENOUS
  Filled 2016-11-19: qty 100

## 2016-11-19 MED ORDER — 0.9 % SODIUM CHLORIDE (POUR BTL) OPTIME
TOPICAL | Status: DC | PRN
  Administered 2016-11-19: 1000 mL

## 2016-11-19 MED ORDER — MIDAZOLAM HCL 2 MG/2ML IJ SOLN
INTRAMUSCULAR | Status: AC
  Filled 2016-11-19: qty 2

## 2016-11-19 MED ORDER — ACETAMINOPHEN 650 MG RE SUPP: 650.0000 mg | RECTAL | Status: DC | PRN

## 2016-11-19 MED ORDER — METOCLOPRAMIDE HCL 5 MG/ML IJ SOLN: 10.0000 mg | Freq: Once | INTRAMUSCULAR | Status: DC | PRN

## 2016-11-19 MED ORDER — SUGAMMADEX SODIUM 200 MG/2ML IV SOLN
INTRAVENOUS | Status: DC | PRN
  Administered 2016-11-19: 160 mg via INTRAVENOUS

## 2016-11-19 MED ORDER — LIDOCAINE 2% (20 MG/ML) 5 ML SYRINGE
INTRAMUSCULAR | Status: DC | PRN
  Administered 2016-11-19: 100 mg via INTRAVENOUS

## 2016-11-19 MED ORDER — FENTANYL CITRATE (PF) 100 MCG/2ML IJ SOLN
INTRAMUSCULAR | Status: AC
  Filled 2016-11-19: qty 2

## 2016-11-19 MED ORDER — DEXAMETHASONE SODIUM PHOSPHATE 10 MG/ML IJ SOLN
INTRAMUSCULAR | Status: DC | PRN
  Administered 2016-11-19: 10 mg via INTRAVENOUS

## 2016-11-19 MED ORDER — ROCURONIUM BROMIDE 100 MG/10ML IV SOLN
INTRAVENOUS | Status: DC | PRN
  Administered 2016-11-19 (×2): 10 mg via INTRAVENOUS
  Administered 2016-11-19: 50 mg via INTRAVENOUS

## 2016-11-19 MED ORDER — SODIUM CHLORIDE 0.9 % IR SOLN
Status: DC | PRN
  Administered 2016-11-19: 10:00:00

## 2016-11-19 MED ORDER — LIDOCAINE-EPINEPHRINE 1 %-1:100000 IJ SOLN
INTRAMUSCULAR | Status: DC | PRN
  Administered 2016-11-19: 10 mL

## 2016-11-19 MED ORDER — LIDOCAINE-EPINEPHRINE 1 %-1:100000 IJ SOLN
INTRAMUSCULAR | Status: AC
  Filled 2016-11-19: qty 1

## 2016-11-19 MED ORDER — PHENOL 1.4 % MT LIQD: 1.0000 | OROMUCOSAL | Status: DC | PRN

## 2016-11-19 MED ORDER — BUPIVACAINE HCL (PF) 0.5 % IJ SOLN
INTRAMUSCULAR | Status: AC
  Filled 2016-11-19: qty 30

## 2016-11-19 MED ORDER — FENTANYL CITRATE (PF) 100 MCG/2ML IJ SOLN
INTRAMUSCULAR | Status: DC | PRN
  Administered 2016-11-19: 125 ug via INTRAVENOUS
  Administered 2016-11-19: 75 ug via INTRAVENOUS
  Administered 2016-11-19: 50 ug via INTRAVENOUS

## 2016-11-19 MED ORDER — SODIUM CHLORIDE 0.9% FLUSH
3.0000 mL | Freq: Two times a day (BID) | INTRAVENOUS | Status: DC
  Administered 2016-11-19: 3 mL via INTRAVENOUS

## 2016-11-19 MED ORDER — ONDANSETRON HCL 4 MG/2ML IJ SOLN: 4.0000 mg | Freq: Four times a day (QID) | INTRAMUSCULAR | Status: DC | PRN

## 2016-11-19 MED ORDER — SODIUM CHLORIDE 0.9 % IV SOLN: INTRAVENOUS | Status: DC

## 2016-11-19 MED ORDER — PROPOFOL 10 MG/ML IV BOLUS
INTRAVENOUS | Status: AC
  Filled 2016-11-19: qty 40

## 2016-11-19 MED ORDER — ONDANSETRON HCL 4 MG PO TABS: 4.0000 mg | ORAL_TABLET | Freq: Four times a day (QID) | ORAL | Status: DC | PRN

## 2016-11-19 MED ORDER — LACTATED RINGERS IV SOLN: INTRAVENOUS | Status: DC

## 2016-11-19 MED ORDER — THROMBIN 5000 UNITS EX SOLR
CUTANEOUS | Status: AC
  Filled 2016-11-19: qty 10000

## 2016-11-19 MED ORDER — DEXAMETHASONE SODIUM PHOSPHATE 10 MG/ML IJ SOLN
INTRAMUSCULAR | Status: AC
  Filled 2016-11-19: qty 1

## 2016-11-19 MED ORDER — LIDOCAINE 2% (20 MG/ML) 5 ML SYRINGE
INTRAMUSCULAR | Status: AC
  Filled 2016-11-19: qty 5

## 2016-11-19 MED ORDER — ONDANSETRON HCL 4 MG/2ML IJ SOLN
INTRAMUSCULAR | Status: AC
  Filled 2016-11-19: qty 2

## 2016-11-19 MED ORDER — GABAPENTIN 300 MG PO CAPS
300.0000 mg | ORAL_CAPSULE | Freq: Three times a day (TID) | ORAL | Status: DC
  Administered 2016-11-19: 300 mg via ORAL
  Filled 2016-11-19: qty 1

## 2016-11-19 MED ORDER — MEPERIDINE HCL 25 MG/ML IJ SOLN: 6.2500 mg | INTRAMUSCULAR | Status: DC | PRN

## 2016-11-19 MED ORDER — PROPOFOL 10 MG/ML IV BOLUS
INTRAVENOUS | Status: DC | PRN
  Administered 2016-11-19: 200 mg via INTRAVENOUS

## 2016-11-19 MED ORDER — ROCURONIUM BROMIDE 10 MG/ML (PF) SYRINGE
PREFILLED_SYRINGE | INTRAVENOUS | Status: AC
  Filled 2016-11-19: qty 5

## 2016-11-19 MED ORDER — SUGAMMADEX SODIUM 200 MG/2ML IV SOLN
INTRAVENOUS | Status: AC
Start: 2016-11-19 — End: 2016-11-19
  Filled 2016-11-19: qty 2

## 2016-11-19 MED ORDER — CYCLOBENZAPRINE HCL 10 MG PO TABS: 10.0000 mg | ORAL_TABLET | Freq: Three times a day (TID) | ORAL | 0 refills | Status: DC | PRN

## 2016-11-19 MED ORDER — THROMBIN 5000 UNITS EX SOLR
CUTANEOUS | Status: DC | PRN
  Administered 2016-11-19 (×2): 5000 [IU] via TOPICAL

## 2016-11-19 MED ORDER — FENTANYL CITRATE (PF) 100 MCG/2ML IJ SOLN
25.0000 ug | INTRAMUSCULAR | Status: DC | PRN
Start: 2016-11-19 — End: 2016-11-19
  Administered 2016-11-19 (×2): 25 ug via INTRAVENOUS

## 2016-11-19 MED ORDER — ACETAMINOPHEN 325 MG PO TABS: 650.0000 mg | ORAL_TABLET | ORAL | Status: DC | PRN

## 2016-11-19 MED ORDER — FENTANYL CITRATE (PF) 250 MCG/5ML IJ SOLN
INTRAMUSCULAR | Status: AC
  Filled 2016-11-19: qty 5

## 2016-11-19 MED ORDER — MENTHOL 3 MG MT LOZG: 1.0000 | LOZENGE | OROMUCOSAL | Status: DC | PRN

## 2016-11-19 MED ORDER — MORPHINE SULFATE (PF) 4 MG/ML IV SOLN
2.0000 mg | INTRAVENOUS | Status: DC | PRN
  Administered 2016-11-19: 2 mg via INTRAVENOUS
  Filled 2016-11-19: qty 1

## 2016-11-19 MED ORDER — LACTATED RINGERS IV SOLN
INTRAVENOUS | Status: DC | PRN
  Administered 2016-11-19 (×2): via INTRAVENOUS

## 2016-11-19 MED ORDER — BUPIVACAINE HCL 0.5 % IJ SOLN
INTRAMUSCULAR | Status: DC | PRN
  Administered 2016-11-19: 10 mL

## 2016-11-19 MED ORDER — HEMOSTATIC AGENTS (NO CHARGE) OPTIME
TOPICAL | Status: DC | PRN
  Administered 2016-11-19: 1 via TOPICAL

## 2016-11-19 MED ORDER — SODIUM CHLORIDE 0.9% FLUSH: 3.0000 mL | INTRAVENOUS | Status: DC | PRN

## 2016-11-19 MED ORDER — MIDAZOLAM HCL 2 MG/2ML IJ SOLN
INTRAMUSCULAR | Status: DC | PRN
  Administered 2016-11-19: 2 mg via INTRAVENOUS

## 2016-11-19 SURGICAL SUPPLY — 57 items
BAG DECANTER FOR FLEXI CONT (MISCELLANEOUS) ×3 IMPLANT
BENZOIN TINCTURE PRP APPL 2/3 (GAUZE/BANDAGES/DRESSINGS) ×3 IMPLANT
BLADE CLIPPER SURG (BLADE) IMPLANT
BLADE SURG 11 STRL SS (BLADE) ×3 IMPLANT
BUR CUTTER 7.0 ROUND (BURR) ×3 IMPLANT
BUR MATCHSTICK NEURO 3.0 LAGG (BURR) ×3 IMPLANT
CANISTER SUCT 3000ML PPV (MISCELLANEOUS) ×3 IMPLANT
CARTRIDGE OIL MAESTRO DRILL (MISCELLANEOUS) ×1 IMPLANT
CLOSURE WOUND 1/2 X4 (GAUZE/BANDAGES/DRESSINGS) ×1
DERMABOND ADVANCED (GAUZE/BANDAGES/DRESSINGS) ×2
DERMABOND ADVANCED .7 DNX12 (GAUZE/BANDAGES/DRESSINGS) ×1 IMPLANT
DIFFUSER DRILL AIR PNEUMATIC (MISCELLANEOUS) ×3 IMPLANT
DRAPE HALF SHEET 40X57 (DRAPES) IMPLANT
DRAPE LAPAROTOMY 100X72X124 (DRAPES) ×3 IMPLANT
DRAPE MICROSCOPE LEICA (MISCELLANEOUS) ×3 IMPLANT
DRAPE POUCH INSTRU U-SHP 10X18 (DRAPES) ×3 IMPLANT
DRAPE SURG 17X23 STRL (DRAPES) ×3 IMPLANT
DRSG OPSITE POSTOP 4X6 (GAUZE/BANDAGES/DRESSINGS) ×3 IMPLANT
DURAPREP 26ML APPLICATOR (WOUND CARE) ×3 IMPLANT
ELECT BLADE 4.0 EZ CLEAN MEGAD (MISCELLANEOUS) ×3
ELECT REM PT RETURN 9FT ADLT (ELECTROSURGICAL) ×3
ELECTRODE BLDE 4.0 EZ CLN MEGD (MISCELLANEOUS) ×1 IMPLANT
ELECTRODE REM PT RTRN 9FT ADLT (ELECTROSURGICAL) ×1 IMPLANT
GAUZE SPONGE 4X4 12PLY STRL (GAUZE/BANDAGES/DRESSINGS) ×3 IMPLANT
GAUZE SPONGE 4X4 16PLY XRAY LF (GAUZE/BANDAGES/DRESSINGS) IMPLANT
GLOVE BIO SURGEON STRL SZ7 (GLOVE) ×3 IMPLANT
GLOVE BIO SURGEON STRL SZ8 (GLOVE) ×3 IMPLANT
GLOVE BIOGEL PI IND STRL 7.0 (GLOVE) ×1 IMPLANT
GLOVE BIOGEL PI IND STRL 7.5 (GLOVE) ×2 IMPLANT
GLOVE BIOGEL PI IND STRL 8 (GLOVE) ×3 IMPLANT
GLOVE BIOGEL PI INDICATOR 7.0 (GLOVE) ×2
GLOVE BIOGEL PI INDICATOR 7.5 (GLOVE) ×4
GLOVE BIOGEL PI INDICATOR 8 (GLOVE) ×6
GLOVE ECLIPSE 7.5 STRL STRAW (GLOVE) ×9 IMPLANT
GLOVE INDICATOR 8.5 STRL (GLOVE) ×3 IMPLANT
GOWN STRL REUS W/ TWL LRG LVL3 (GOWN DISPOSABLE) ×2 IMPLANT
GOWN STRL REUS W/ TWL XL LVL3 (GOWN DISPOSABLE) ×2 IMPLANT
GOWN STRL REUS W/TWL 2XL LVL3 (GOWN DISPOSABLE) ×3 IMPLANT
GOWN STRL REUS W/TWL LRG LVL3 (GOWN DISPOSABLE) ×4
GOWN STRL REUS W/TWL XL LVL3 (GOWN DISPOSABLE) ×4
KIT BASIN OR (CUSTOM PROCEDURE TRAY) ×3 IMPLANT
KIT ROOM TURNOVER OR (KITS) ×3 IMPLANT
NEEDLE HYPO 22GX1.5 SAFETY (NEEDLE) ×3 IMPLANT
NEEDLE SPNL 22GX3.5 QUINCKE BK (NEEDLE) ×3 IMPLANT
NS IRRIG 1000ML POUR BTL (IV SOLUTION) ×3 IMPLANT
OIL CARTRIDGE MAESTRO DRILL (MISCELLANEOUS) ×3
PACK LAMINECTOMY NEURO (CUSTOM PROCEDURE TRAY) ×3 IMPLANT
RUBBERBAND STERILE (MISCELLANEOUS) ×6 IMPLANT
SPONGE SURGIFOAM ABS GEL SZ50 (HEMOSTASIS) ×3 IMPLANT
STRIP CLOSURE SKIN 1/2X4 (GAUZE/BANDAGES/DRESSINGS) ×2 IMPLANT
SUT VIC AB 0 CT1 18XCR BRD8 (SUTURE) ×1 IMPLANT
SUT VIC AB 0 CT1 8-18 (SUTURE) ×2
SUT VIC AB 2-0 CT1 18 (SUTURE) ×3 IMPLANT
SUT VICRYL 4-0 PS2 18IN ABS (SUTURE) ×3 IMPLANT
TOWEL GREEN STERILE (TOWEL DISPOSABLE) ×3 IMPLANT
TOWEL GREEN STERILE FF (TOWEL DISPOSABLE) ×3 IMPLANT
WATER STERILE IRR 1000ML POUR (IV SOLUTION) ×3 IMPLANT

## 2016-11-19 NOTE — Anesthesia Procedure Notes (Signed)
Procedure Name: Intubation Date/Time: 11/19/2016 8:41 AM Performed by: Trixie Deis A Pre-anesthesia Checklist: Patient identified, Emergency Drugs available, Suction available and Patient being monitored Patient Re-evaluated:Patient Re-evaluated prior to induction Oxygen Delivery Method: Circle System Utilized Preoxygenation: Pre-oxygenation with 100% oxygen Induction Type: IV induction Ventilation: Mask ventilation without difficulty Laryngoscope Size: Mac and 3 Grade View: Grade I Tube type: Oral Tube size: 7.0 mm Number of attempts: 1 Airway Equipment and Method: Stylet and Oral airway Placement Confirmation: ETT inserted through vocal cords under direct vision,  positive ETCO2 and breath sounds checked- equal and bilateral Secured at: 21 cm Tube secured with: Tape Dental Injury: Teeth and Oropharynx as per pre-operative assessment

## 2016-11-19 NOTE — Anesthesia Postprocedure Evaluation (Signed)
Anesthesia Post Note  Patient: [name]  Procedure(s) Performed: Procedure(s) (LRB): Right Lumbar Four-Five Microdiscectomy (Right)     Patient location during evaluation: PACU Anesthesia Type: General Level of consciousness: awake and alert Pain management: pain level controlled Vital Signs Assessment: post-procedure vital signs reviewed and stable Respiratory status: spontaneous breathing, nonlabored ventilation, respiratory function stable and patient connected to nasal cannula oxygen Cardiovascular status: blood pressure returned to baseline and stable Postop Assessment: no signs of nausea or vomiting Anesthetic complications: no    Last Vitals:  Vitals:   11/19/16 1200 11/19/16 1222  BP: 136/84 (!) 131/92  Pulse: 75 76  Resp: 13 18  Temp:  36.5 C  SpO2: 99% 100%    Last Pain:  Vitals:   11/19/16 1115  TempSrc:   PainSc: Asleep                 Montez Hageman

## 2016-11-19 NOTE — Op Note (Signed)
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  EBL: Minimal  History of present illness: Patient is a 46 year old female has had long-standing back and right leg pain rating down L5 nerve root pattern. Workup revealed a large synovial cyst causing severe right-sided L5 nerve root compression. Due to patient's failed conservative treatment imaging findings and progressive clinical syndrome I recommended lumbar laminectomy for resection of right-sided synovial cyst. I extensively reviewed the risks and benefits of the operation the patient as well as perioperative course expectations of outcome and alternatives of surgery and she understood and agreed to proceed forward.  Operative procedure: Patient brought into the or was just a general anesthesia positioned prone the Wilson frame her back was prepped and draped in routine sterile fashion. Preoperative x-ray localize the appropriate level so after infiltration of 10 mL lidocaine with epi a midline incision was made and Bovie left car was used to take down the subcutaneous tissues and subperiosteal dissection was carried out on the lamina of L4 and L5 on the right. Interoperative x-ray confirmed defecation appropriate level so a high-speed drill was used to drill down the inferior aspect of lamina of L4 medial facet complex super aspect of the lamina of L5. Laminotomy was begun with a 2 and 3 mm Kerrison punch. Ligament flows note was identified and noted be markedly hypertrophied so this was removed in piecemeal fashion exposing the thecal sac. Immediately identified was a small bulbous pseudomonas seal it looked like it might of in the remnant of a  previous injection. There was no active CSF it was well-healed and certainly chronic. So under microscopic illumination a densely adherent cyst membrane was teased off of the dura large degenerative synovial cyst was immediately identified coming off the medial facet joint and extending inferiorly along the medial border of the pedicle displacing the L5 nerve root. This was teased off of the L5 nerve root and cyst was removed in piecemeal fashion. The L5 foramen was unroofed and completely decompressed the L5 nerve root was also decompressed. Marching superiorly further under bit the medial gutter identify the disc space at this was not affecting the L5 nerve root. Marching superiorly I identified the L4 foramen easily passed a coronary dilator out the L4 foramen. There was a dense amount of adherence on the undersurface of the dura and the lateral dura extending up to the inferior 4 foramen I tease this away enough to decompress but I did not try to resect that membrane off the dura but I did remove it from the lateral facet joint. At the end cystectomy was no further stenosis on thecal sac or right L5 nerve root. Wounds and to see her get meticulous in space was maintained Gelfoam was laid top of the dura and the wounds closed in layers with interrupted Vicryl skin was closed running 4 subcuticular Dermabond benzo and Steri-Strips and a sterile dressing was applied patient recovered from in stable condition. At the end the case all needle counts and sponge counts were correct.

## 2016-11-19 NOTE — Transfer of Care (Signed)
Immediate Anesthesia Transfer of Care Note  Patient: [name]  Procedure(s) Performed: Procedure(s): Right Lumbar Four-Five Microdiscectomy (Right)  Patient Location: PACU  Anesthesia Type:General  Level of Consciousness: awake, alert  and oriented  Airway & Oxygen Therapy: Patient Spontanous Breathing and Patient connected to nasal cannula oxygen  Post-op Assessment: Report given to RN, Post -op Vital signs reviewed and stable and Patient moving all extremities  Post vital signs: Reviewed and stable  Last Vitals:  Vitals:   11/19/16 0706  BP: 139/65  Pulse: 73  Resp: 20  Temp: 36.7 C  SpO2: 100%    Last Pain:  Vitals:   11/19/16 0706  TempSrc: Oral  PainSc: 6       Patients Stated Pain Goal: 3 (93/55/21 7471)  Complications: No apparent anesthesia complications

## 2016-11-19 NOTE — Progress Notes (Signed)
Discharged instructions/education/AVS given to patient with family at bedside and they all verbalized understanding. Pain is mild to moderate. Voiding well and ambulating well with supervision. No drainage, no swelling, no redness noted on incision site. Patient waiting for transport home.

## 2016-11-19 NOTE — H&P (Signed)
[name] is an 46 y.o. female.   Chief Complaint: Back and right leg pain HPI: 46 year old female with back and right leg pain radiating down L5 nerve root pattern. Workup revealed large synovial cyst with possible free fragment disc herniation at L4-5 displacing the right L5 nerve against the right L5 pedicle. Due to patient's failure conservative treatment imaging findings and progression of clinical syndrome I recommended laminectomy for resection of synovial cyst and possible discectomy. I extensively reviewed the risks and benefits of the operation with the patient as well as perioperative course expectations of outcome and alternatives of surgery and she understood and agreed to proceed forward.  Past Medical History:  Diagnosis Date  . Allergy   . Arthritis   . Frequent headaches   . GERD (gastroesophageal reflux disease)   . Migraines   . Urinary tract infection     Past Surgical History:  Procedure Laterality Date  . TUBAL LIGATION      Family History  Problem Relation Age of Onset  . Cancer Mother        breast ca  . Diabetes Sister   . Cancer Maternal Aunt        colon  . Cancer Maternal Grandmother        uterine   Social History:  reports that she quit smoking about 2 years ago. Her smoking use included Cigarettes. She has never used smokeless tobacco. She reports that she does not drink alcohol or use drugs.  Allergies: No Known Allergies  Medications Prior to Admission  Medication Sig Dispense Refill  . acetaminophen (TYLENOL) 500 MG tablet Take 1,000 mg by mouth every 6 (six) hours as needed.    . Aspirin-Caffeine (BC FAST PAIN RELIEF ARTHRITIS PO) Take 1 packet by mouth 2 (two) times daily as needed (pain).    Marland Kitchen diphenhydrAMINE (BENADRYL) 25 MG tablet Take 25 mg by mouth daily as needed for allergies.     Marland Kitchen gabapentin (NEURONTIN) 300 MG capsule Take 1 capsule by mouth at night for 7 nights and then 1 in the morning and one at night for 7 days and then 3  times a day. (Patient taking differently: Take 300 mg by mouth 3 (three) times daily. Take 1 capsule by mouth at night for 7 nights and then 1 in the morning and one at night for 7 days and then 3 times a day.) 90 capsule 0  . tiZANidine (ZANAFLEX) 2 MG tablet Take 2 mg by mouth every 6 (six) hours as needed for muscle spasms.    Marland Kitchen acetaminophen (TYLENOL) 325 MG tablet Take 650 mg by mouth every 6 (six) hours as needed for headache.    . cyclobenzaprine (FLEXERIL) 5 MG tablet Take 1 tablet (5 mg total) by mouth 3 (three) times daily as needed for muscle spasms. (Patient not taking: Reported on 11/11/2016) 30 tablet 1  . HYDROcodone-acetaminophen (NORCO/VICODIN) 5-325 MG tablet Take 1 tablet by mouth every 6 (six) hours as needed for moderate pain. (Patient not taking: Reported on 11/11/2016) 28 tablet 0  . methylPREDNISolone (MEDROL DOSEPAK) 4 MG TBPK tablet Follow package instruction (Patient not taking: Reported on 11/11/2016) 21 tablet 0  . nabumetone (RELAFEN) 500 MG tablet Take 1 tablet (500 mg total) by mouth 2 (two) times daily. (Patient not taking: Reported on 11/11/2016) 60 tablet 0  . oxyCODONE-acetaminophen (PERCOCET/ROXICET) 5-325 MG tablet Take 1 tablet by mouth every 4 (four) hours as needed for severe pain. (Patient not taking: Reported on 11/11/2016) 15  tablet 0    No results found for this or any previous visit (from the past 48 hour(s)). No results found.  Review of Systems  Musculoskeletal: Positive for back pain, joint pain and myalgias.  Neurological: Positive for tingling and sensory change.    Blood pressure 139/65, pulse 73, temperature 98.1 F (36.7 C), temperature source Oral, resp. rate 20, height 5\' 1"  (1.549 m), weight 78.1 kg (172 lb 3.2 oz), last menstrual period 11/13/2016, SpO2 100 %. Physical Exam  Constitutional: She is oriented to person, place, and time. She appears well-developed and well-nourished.  HENT:  Head: Normocephalic.  Eyes: Pupils are equal, round, and  reactive to light.  Neck: Normal range of motion.  Respiratory: Effort normal.  GI: Soft.  Neurological: She is alert and oriented to person, place, and time. She has normal strength. GCS eye subscore is 4. GCS verbal subscore is 5. GCS motor subscore is 6.  Strength 5 out of 5 left lower extremities however right looks to me significantly limited with pain appears to have 4+ out of 5 weakness on dorsiflexion and EHL but very pain limited very difficult to get the patient to give good effort.     Assessment/Plan 46 year old presents for right-sided L4-5 laminectomy for resection of synovial cyst with possible discectomy.  [name] P, MD 11/19/2016, 8:19 AM

## 2016-11-19 NOTE — Anesthesia Preprocedure Evaluation (Signed)
Anesthesia Evaluation  Patient identified by MRN, date of birth, ID band Patient awake    Reviewed: Allergy & Precautions, NPO status , Patient's Chart, lab work & pertinent test results  Airway Mallampati: II  TM Distance: >3 FB Neck ROM: Full    Dental no notable dental hx.    Pulmonary former smoker,    Pulmonary exam normal breath sounds clear to auscultation       Cardiovascular negative cardio ROS Normal cardiovascular exam Rhythm:Regular Rate:Normal     Neuro/Psych negative neurological ROS  negative psych ROS   GI/Hepatic negative GI ROS, Neg liver ROS,   Endo/Other  negative endocrine ROS  Renal/GU negative Renal ROS  negative genitourinary   Musculoskeletal negative musculoskeletal ROS (+)   Abdominal   Peds negative pediatric ROS (+)  Hematology negative hematology ROS (+)   Anesthesia Other Findings   Reproductive/Obstetrics negative OB ROS                             Anesthesia Physical Anesthesia Plan  ASA: II  Anesthesia Plan: General   Post-op Pain Management:    Induction: Intravenous  PONV Risk Score and Plan: 3 and Ondansetron, Dexamethasone and Midazolam  Airway Management Planned: Oral ETT  Additional Equipment:   Intra-op Plan:   Post-operative Plan: Extubation in OR  Informed Consent: I have reviewed the patients History and Physical, chart, labs and discussed the procedure including the risks, benefits and alternatives for the proposed anesthesia with the patient or authorized representative who has indicated his/her understanding and acceptance.   Dental advisory given  Plan Discussed with: CRNA  Anesthesia Plan Comments:         Anesthesia Quick Evaluation

## 2016-11-20 ENCOUNTER — Encounter (HOSPITAL_COMMUNITY): Payer: Self-pay | Admitting: Neurosurgery

## 2016-12-03 NOTE — Discharge Summary (Signed)
Physician Discharge Summary  Patient ID: [name] MRN: [id number] DOB/AGE: [birth year] 46 y.o.  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 Postoperatively patient did very well recovered in the floor on the floor was angling and voiding spontaneously and stable for discharge home.  Consults: Significant Diagnostic Studies: Treatments: Right L4-5 laminectomy for resection of synovial cyst Discharge Exam: Blood pressure (!) 133/92, pulse 90, temperature 98.6 F (37 C), temperature source Oral, resp. rate 18, height 5\' 1"  (1.549 m), weight 78.1 kg (172 lb 3.2 oz), last menstrual period 11/13/2016, SpO2 100 %. Strength out of 5 wound clean dry and intact  Disposition: Home  Discharge Instructions    Incentive spirometry RT    Complete by:  As directed      Allergies as of 11/19/2016   No Known Allergies     Medication List    TAKE these medications   acetaminophen 325 MG tablet Commonly known as:  TYLENOL Take 650 mg by mouth every 6 (six) hours as needed for headache.   acetaminophen 500 MG tablet Commonly known as:  TYLENOL Take 1,000 mg by mouth every 6 (six) hours as needed.   BC FAST PAIN RELIEF ARTHRITIS PO Take 1 packet by mouth 2 (two) times daily as needed (pain).   cyclobenzaprine 5 MG tablet Commonly known as:  FLEXERIL Take 1 tablet (5 mg total) by mouth 3 (three) times daily as needed for muscle spasms. What changed:  Another medication with the same name was added. Make sure you understand how and when to take each.   cyclobenzaprine 10 MG tablet Commonly known as:  FLEXERIL Take 1 tablet (10 mg total) by mouth 3 (three) times daily as needed for muscle spasms. What  changed:  You were already taking a medication with the same name, and this prescription was added. Make sure you understand how and when to take each.   diphenhydrAMINE 25 MG tablet Commonly known as:  BENADRYL Take 25 mg by mouth daily as needed for allergies.   gabapentin 300 MG capsule Commonly known as:  NEURONTIN Take 1 capsule by mouth at night for 7 nights and then 1 in the morning and one at night for 7 days and then 3 times a day. What changed:  how much to take  how to take this  when to take this  additional instructions   HYDROcodone-acetaminophen 5-325 MG tablet Commonly known as:  NORCO/VICODIN Take 1 tablet by mouth every 6 (six) hours as needed for moderate pain.   methylPREDNISolone 4 MG Tbpk tablet Commonly known as:  MEDROL DOSEPAK Follow package instruction   nabumetone 500 MG tablet Commonly known as:  RELAFEN Take 1 tablet (500 mg total) by mouth 2 (two) times daily.   oxyCODONE-acetaminophen 5-325 MG tablet Commonly known as:  PERCOCET/ROXICET Take 1 tablet by mouth every 4 (four) hours as needed for severe pain.   tiZANidine 2 MG tablet Commonly known as:  ZANAFLEX Take 2 mg by mouth every 6 (six) hours as needed for muscle spasms.            Discharge Care Instructions        Start     Ordered   11/19/16 0000  Incentive spirometry RT     11/19/16 1026  11/19/16 0000  cyclobenzaprine (FLEXERIL) 10 MG tablet  3 times daily PRN     11/19/16 1924       Signed: Sevana Grandinetti P 12/03/2016, 10:15 AM

## 2018-05-02 ENCOUNTER — Encounter (HOSPITAL_COMMUNITY): Payer: Self-pay

## 2018-05-02 ENCOUNTER — Emergency Department (HOSPITAL_COMMUNITY): Payer: Medicaid Other

## 2018-05-02 ENCOUNTER — Emergency Department (HOSPITAL_COMMUNITY)
Admission: EM | Admit: 2018-05-02 | Discharge: 2018-05-02 | Disposition: A | Discharge status: Home / Self Care | Payer: Medicaid Other | Attending: Emergency Medicine | Admitting: Emergency Medicine

## 2018-05-02 DIAGNOSIS — W2201XA Walked into wall, initial encounter: Secondary | ICD-10-CM | POA: Insufficient documentation

## 2018-05-02 DIAGNOSIS — S90121A Contusion of right lesser toe(s) without damage to nail, initial encounter: Secondary | ICD-10-CM | POA: Diagnosis not present

## 2018-05-02 DIAGNOSIS — Z79899 Other long term (current) drug therapy: Secondary | ICD-10-CM | POA: Diagnosis not present

## 2018-05-02 DIAGNOSIS — Y92009 Unspecified place in unspecified non-institutional (private) residence as the place of occurrence of the external cause: Secondary | ICD-10-CM | POA: Diagnosis not present

## 2018-05-02 DIAGNOSIS — Z87891 Personal history of nicotine dependence: Secondary | ICD-10-CM | POA: Diagnosis not present

## 2018-05-02 DIAGNOSIS — Y998 Other external cause status: Secondary | ICD-10-CM | POA: Insufficient documentation

## 2018-05-02 DIAGNOSIS — Y9389 Activity, other specified: Secondary | ICD-10-CM | POA: Diagnosis not present

## 2018-05-02 DIAGNOSIS — S99921A Unspecified injury of right foot, initial encounter: Secondary | ICD-10-CM | POA: Diagnosis present

## 2018-05-02 MED ORDER — IBUPROFEN 200 MG PO TABS
600.0000 mg | ORAL_TABLET | Freq: Once | ORAL | Status: AC
  Administered 2018-05-02: 600 mg via ORAL
  Filled 2018-05-02: qty 3

## 2018-05-02 NOTE — Discharge Instructions (Addendum)
Follow up with your doctor.  Return here as needed 

## 2018-05-02 NOTE — ED Provider Notes (Signed)
[facility] DEPT Provider Note   CSN: [id number] Arrival date & time: 05/02/18  1500     History   Chief Complaint Chief Complaint  Patient presents with  . Toe Injury    HPI [name] is a 48 y.o. female who presents to the ED with right foot pain. Patient reports she hit her foot on the wall last night and it felt like it jammed her little toe. Patient c/o pain and swelling. She has taken nothing for pain.   HPI  Past Medical History:  Diagnosis Date  . Allergy   . Arthritis   . Frequent headaches   . GERD (gastroesophageal reflux disease)   . Migraines   . Urinary tract infection     Patient Active Problem List   Diagnosis Date Noted  . HNP (herniated nucleus pulposus), lumbar 11/19/2016    Past Surgical History:  Procedure Laterality Date  . LUMBAR LAMINECTOMY/DECOMPRESSION MICRODISCECTOMY Right 11/19/2016   Procedure: Right Lumbar Four-Five Microdiscectomy;  Surgeon: Kary Kos, MD;  Location: [place];  Service: Neurosurgery;  Laterality: Right;  . TUBAL LIGATION       OB History    Gravida  0   Para  0   Term  0   Preterm  0   AB  0   Living  0     SAB  0   TAB  0   Ectopic  0   Multiple  0   Live Births               Home Medications    Prior to Admission medications   Medication Sig Start Date End Date Taking? Authorizing Provider  acetaminophen (TYLENOL) 325 MG tablet Take 650 mg by mouth every 6 (six) hours as needed for headache.    [provider]  acetaminophen (TYLENOL) 500 MG tablet Take 1,000 mg by mouth every 6 (six) hours as needed.    [provider]  Aspirin-Caffeine (BC FAST PAIN RELIEF ARTHRITIS PO) Take 1 packet by mouth 2 (two) times daily as needed (pain).    [provider]  cyclobenzaprine (FLEXERIL) 10 MG tablet Take 1 tablet (10 mg total) by mouth 3 (three) times daily as needed for muscle spasms. 11/19/16   Kary Kos, MD  cyclobenzaprine  (FLEXERIL) 5 MG tablet Take 1 tablet (5 mg total) by mouth 3 (three) times daily as needed for muscle spasms. Patient not taking: Reported on 11/11/2016 09/08/16   Lucretia Kern, DO  diphenhydrAMINE (BENADRYL) 25 MG tablet Take 25 mg by mouth daily as needed for allergies.     [provider]  gabapentin (NEURONTIN) 300 MG capsule Take 1 capsule by mouth at night for 7 nights and then 1 in the morning and one at night for 7 days and then 3 times a day. Patient taking differently: Take 300 mg by mouth 3 (three) times daily. Take 1 capsule by mouth at night for 7 nights and then 1 in the morning and one at night for 7 days and then 3 times a day. 09/30/16   Magnus Sinning, MD  HYDROcodone-acetaminophen (NORCO/VICODIN) 5-325 MG tablet Take 1 tablet by mouth every 6 (six) hours as needed for moderate pain. Patient not taking: Reported on 11/11/2016 09/30/16   Magnus Sinning, MD  methylPREDNISolone (MEDROL DOSEPAK) 4 MG TBPK tablet Follow package instruction Patient not taking: Reported on 11/11/2016 10/13/16   Kinnie Feil, PA-C  nabumetone (RELAFEN) 500 MG tablet Take 1  tablet (500 mg total) by mouth 2 (two) times daily. Patient not taking: Reported on 11/11/2016 09/18/16   Magnus Sinning, MD  oxyCODONE-acetaminophen (PERCOCET/ROXICET) 5-325 MG tablet Take 1 tablet by mouth every 4 (four) hours as needed for severe pain. Patient not taking: Reported on 11/11/2016 09/27/16   Domenic Moras, PA-C  tiZANidine (ZANAFLEX) 2 MG tablet Take 2 mg by mouth every 6 (six) hours as needed for muscle spasms.    [provider]    Family History Family History  Problem Relation Age of Onset  . Cancer Mother        breast ca  . Diabetes Sister   . Cancer Maternal Aunt        colon  . Cancer Maternal Grandmother        uterine    Social History Social History   Tobacco Use  . Smoking status: Former Smoker    Types: Cigarettes    Last attempt to quit: 12/06/2013    Years since quitting: 4.4  .  Smokeless tobacco: Never Used  . Tobacco comment: just quit recently  Substance Use Topics  . Alcohol use: No  . Drug use: No     Allergies   Patient has no known allergies.   Review of Systems Review of Systems  Musculoskeletal: Positive for arthralgias.  Skin: Positive for color change.  All other systems reviewed and are negative.    Physical Exam Updated Vital Signs BP 109/74 (BP Location: Right Arm)   Pulse 84   Temp 98 F (36.7 C) (Oral)   Resp 18   LMP 04/25/2018 (Approximate)   SpO2 100%   Physical Exam Vitals signs and nursing note reviewed.  Constitutional:      Appearance: She is well-developed.  HENT:     Head: Normocephalic and atraumatic.  Neck:     Musculoskeletal: Neck supple.  Cardiovascular:     Rate and Rhythm: Normal rate.  Pulmonary:     Effort: Pulmonary effort is normal.  Musculoskeletal:     Right foot: Normal capillary refill. Tenderness (little toe) and swelling present. No deformity or laceration.     Comments: Tenderness and swelling to the right little toe. Ecchymosis noted. Limited range of motion due to pain. Pedal pulse 2+, adequate circulation.   Skin:    General: Skin is warm and dry.  Neurological:     Mental Status: She is alert and oriented to person, place, and time.  Psychiatric:        Mood and Affect: Mood normal.      ED Treatments / Results  Labs (all labs ordered are listed, but only abnormal results are displayed) Labs Reviewed - No data to display Radiology Dg Foot Complete Right  Result Date: 05/02/2018 CLINICAL DATA:  Stubbed fifth toe with redness and pain, initial encounter EXAM: RIGHT FOOT COMPLETE - 3+ VIEW COMPARISON:  None. FINDINGS: Mild soft tissue swelling is noted. No acute fracture or dislocation is seen. IMPRESSION: No acute fracture is noted. Mild soft tissue swelling of the fifth digit is seen. Electronically Signed   By: Inez Catalina M.D.   On: 05/02/2018 16:17    Procedures Procedures  (including critical care time)  Medications Ordered in ED Medications  ibuprofen (ADVIL,MOTRIN) tablet 600 mg (has no administration in time range)     Initial Impression / Assessment and Plan / ED Course  I have reviewed the triage vital signs and the nursing notes.  48 y.o. female here with pain and swelling  of the right little toe s/p injury last night stable for d/c without fracture or dislocation noted on x-ray and no focal neuro deficits. Buddy tape, post op shoe, elevation and ice. Ibuprofen as needed for pain. F/u with PCP.  Final Clinical Impressions(s) / ED Diagnoses   Final diagnoses:  Contusion of fifth toe of right foot, initial encounter    ED Discharge Orders    None       Debroah Baller [place], [place] 05/02/18 1655    Duffy Bruce, MD 05/03/18 [place]

## 2018-05-02 NOTE — ED Triage Notes (Signed)
Pt presents with c/o right pink toe pain. Pt reports she jammed her toe on the wall running to the bathroom last night.

## 2018-05-24 ENCOUNTER — Other Ambulatory Visit: Payer: Self-pay

## 2018-05-24 ENCOUNTER — Encounter (HOSPITAL_COMMUNITY): Payer: Self-pay | Admitting: *Deleted

## 2018-05-24 ENCOUNTER — Inpatient Hospital Stay (HOSPITAL_COMMUNITY)
Admission: AD | Admit: 2018-05-24 | Discharge: 2018-05-24 | Disposition: A | Discharge status: Home / Self Care | Payer: Medicaid Other | Attending: Obstetrics and Gynecology | Admitting: Obstetrics and Gynecology

## 2018-05-24 DIAGNOSIS — A6009 Herpesviral infection of other urogenital tract: Secondary | ICD-10-CM | POA: Diagnosis not present

## 2018-05-24 DIAGNOSIS — N898 Other specified noninflammatory disorders of vagina: Secondary | ICD-10-CM | POA: Diagnosis present

## 2018-05-24 DIAGNOSIS — N920 Excessive and frequent menstruation with regular cycle: Secondary | ICD-10-CM | POA: Diagnosis present

## 2018-05-24 DIAGNOSIS — I863 Vulval varices: Secondary | ICD-10-CM | POA: Insufficient documentation

## 2018-05-24 DIAGNOSIS — Z3202 Encounter for pregnancy test, result negative: Secondary | ICD-10-CM | POA: Diagnosis not present

## 2018-05-24 HISTORY — DX: Anemia, unspecified: D64.9

## 2018-05-24 HISTORY — DX: Benign neoplasm of connective and other soft tissue, unspecified: D21.9

## 2018-05-24 LAB — CBC
HCT: 32.2 % — ABNORMAL LOW (ref 36.0–46.0)
HEMOGLOBIN: 9.4 g/dL — AB (ref 12.0–15.0)
MCH: 20.1 pg — AB (ref 26.0–34.0)
MCHC: 29.2 g/dL — AB (ref 30.0–36.0)
MCV: 68.8 fL — ABNORMAL LOW (ref 80.0–100.0)
PLATELETS: 142 10*3/uL — AB (ref 150–400)
RBC: 4.68 MIL/uL (ref 3.87–5.11)
RDW: 22.1 % — AB (ref 11.5–15.5)
WBC: 7.5 10*3/uL (ref 4.0–10.5)
nRBC: 0 % (ref 0.0–0.2)

## 2018-05-24 LAB — URINALYSIS, ROUTINE W REFLEX MICROSCOPIC
Bilirubin Urine: NEGATIVE
GLUCOSE, UA: NEGATIVE mg/dL
HGB URINE DIPSTICK: NEGATIVE
KETONES UR: NEGATIVE mg/dL
LEUKOCYTE UA: NEGATIVE
Nitrite: NEGATIVE
PH: 7 (ref 5.0–8.0)
PROTEIN: NEGATIVE mg/dL
Specific Gravity, Urine: <1.005 — ABNORMAL LOW (ref 1.005–1.030)

## 2018-05-24 LAB — WET PREP, GENITAL
Clue Cells Wet Prep HPF POC: NONE SEEN
SPERM: NONE SEEN
TRICH WET PREP: NONE SEEN
YEAST WET PREP: NONE SEEN

## 2018-05-24 LAB — POCT PREGNANCY, URINE: PREG TEST UR: NEGATIVE

## 2018-05-24 MED ORDER — VALACYCLOVIR HCL 1 G PO TABS: 1000.0000 mg | ORAL_TABLET | Freq: Two times a day (BID) | ORAL | 0 refills | Status: DC

## 2018-05-24 NOTE — MAU Provider Note (Addendum)
History:  [name] is a 48 y.o. [id number] who presents to the maternity assessment unit for menorrhagia and pelvic lesions.  She notes that her symptoms began 3 weeks ago.  She notes that her last menstrual period began 3 weeks ago.  She states that it lasted 2 weeks in duration.  She notes that her menstrual period was longer in duration than normal and had a significant amount of blood and clots.  She denies changes in menstrual cramps or abdominal pain.  She also denies changes in urination.  However, she does note recent onset diarrhea.  She also notes that she felt lesions in her groin which began weeks ago. Patient notes that they are tender and itch occasionally.  She notes that they become aggravated when she showers or wipes the area.    She notes that over the past few months she has been sexually active with one female partner.  She states that she has been using condoms for protection of pregnancy and STIs.  However, she notes one occasion where she did not use a condom.  She has a history of chlamydia and gonorrhea years ago.    Review of Systems:  Review of Systems  Constitutional: Negative for malaise/fatigue.  Gastrointestinal: Positive for diarrhea. Negative for abdominal pain, blood in stool and constipation.  Genitourinary: Positive for urgency.  Skin: Positive for itching.     Objective:  Physical Exam BP (!) 141/81 (BP Location: Right Arm)   Pulse 74   Temp 98.8 F (37.1 C) (Oral)   Resp 18   Wt 70.5 kg   LMP 04/26/2018   SpO2 100%   BMI 29.38 kg/m  Physical Exam  Constitutional: She appears well-developed and well-nourished.  Cardiovascular: Normal rate and regular rhythm.  Respiratory: Breath sounds normal. No respiratory distress.  GI: There is no abdominal tenderness. There is no guarding.  Genitourinary:       Pelvic exam was performed with patient supine.  There is lesion on the left labia. Cervix exhibits no discharge and no friability. Right  adnexum displays no mass. Left adnexum displays no mass.  No evidence of cervical motion tenderness.      Labs and Imaging Results for orders placed or performed during the hospital encounter of 05/24/18 (from the past 24 hour(s))  Urinalysis, Routine w reflex microscopic     Status: Abnormal   Collection Time: 05/24/18  2:03 PM  Result Value Ref Range   Color, Urine YELLOW YELLOW   APPearance CLEAR CLEAR   Specific Gravity, Urine <1.005 (L) 1.005 - 1.030   pH 7.0 5.0 - 8.0   Glucose, UA NEGATIVE NEGATIVE mg/dL   Hgb urine dipstick NEGATIVE NEGATIVE   Bilirubin Urine NEGATIVE NEGATIVE   Ketones, ur NEGATIVE NEGATIVE mg/dL   Protein, ur NEGATIVE NEGATIVE mg/dL   Nitrite NEGATIVE NEGATIVE   Leukocytes,Ua NEGATIVE NEGATIVE  Pregnancy, urine POC     Status: None   Collection Time: 05/24/18  2:08 PM  Result Value Ref Range   Preg Test, Ur NEGATIVE NEGATIVE  CBC     Status: Abnormal   Collection Time: 05/24/18  3:44 PM  Result Value Ref Range   WBC 7.5 4.0 - 10.5 K/uL   RBC 4.68 3.87 - 5.11 MIL/uL   Hemoglobin 9.4 (L) 12.0 - 15.0 g/dL   HCT 32.2 (L) 36.0 - 46.0 %   MCV 68.8 (L) 80.0 - 100.0 fL   MCH 20.1 (L) 26.0 - 34.0 pg   MCHC 29.2 (  L) 30.0 - 36.0 g/dL   RDW 22.1 (H) 11.5 - 15.5 %   Platelets 142 (L) 150 - 400 K/uL   nRBC 0.0 0.0 - 0.2 %  Wet prep, genital     Status: Abnormal   Collection Time: 05/24/18  3:54 PM  Result Value Ref Range   Yeast Wet Prep HPF POC NONE SEEN NONE SEEN   Trich, Wet Prep NONE SEEN NONE SEEN   Clue Cells Wet Prep HPF POC NONE SEEN NONE SEEN   WBC, Wet Prep HPF POC MODERATE (A) NONE SEEN   Sperm NONE SEEN       Assessment & Plan:  Lesion on right posterior perineum.  Based on the appearance of the lesion and the pain associated with palpation and swab, lesion correlates with Herpes Simplex lesion.  Culture sent to lab for evaluation patient will be called when results are available.  Patient prescribed valtrex.  Prescription sent to [facility]  electronically.  Patient presents with a recent history of menorrhagia.  Genital wet prep, GC/Chlamydia probe, HIV antibody swabs performed. Cultures sent to the lab for evaluation.  Patient will be called when results are available.  CBC and RPR also performed to rule out chlamydia and blood dyscrasias.  Patient will be called when results are available.    Four lesions present on the left labia minora.  Lesions consistent with varicosities.  Condition discussed with patient.  No treatment at this time.     Jeralyn Bennett PA-S 05/24/2018 3:32 PM  I confirm that I have verified the information documented in the physician assistant student's note and that I have also personally reperformed the history, physical exam and all medical decision making activities of this service and have verified that all service and findings are accurately documented in this student's note.    Laury Deep, CNM 05/24/2018 6:23 PM

## 2018-05-24 NOTE — Discharge Instructions (Signed)
On May 30, 2018, the [facility] will be moving to the [facility] At that time, the MAU (Maternity Admissions Unit), where you are being seen today, will no longer take care of non-pregnant patients. We strongly encourage you to find a doctor's office before that time, so that you can be seen with any GYN concerns, like vaginal discharge, urinary tract infection, etc.. in a timely manner.  In order to make an office visit more convenient, the Center for [place] at [facility] will be offering evening hours with same-day appointments, walk-in appointments and scheduled appointments available during this time.  Center for [facility] Hours: Monday - 8am - 7:30 pm with walk-in between 4pm- 7:30 pm Tuesday - 8 am - 5 pm open late and accepting walk-ins from 4pm - 7:30pm Wednesday - 8 am - 5 pm open late and accepting walk-ins from 4pm - 7:30pm Thursday 8 am - 5 pm open late and accepting walk-ins from 4pm - 7:30pm Friday 8 am - 5 pm  For an appointment please call the Center for [place] @ [facility] at [phone]  For urgent needs, [facility] is also available for management of urgent GYN complaints such as vaginal discharge or urinary tract infections.

## 2018-05-24 NOTE — MAU Note (Signed)
The last time her period was on, it was on a real long time(lasted 3 wks).  When it went off, she noted there were 3 bumps on the inside of her vagina, area is real irritated and itching

## 2018-05-25 LAB — RPR: RPR Ser Ql: NONREACTIVE

## 2018-05-25 LAB — HIV ANTIBODY (ROUTINE TESTING W REFLEX): HIV SCREEN 4TH GENERATION: NONREACTIVE

## 2018-05-25 LAB — GC/CHLAMYDIA PROBE AMP (~~LOC~~) NOT AT ARMC
Chlamydia: NEGATIVE
Neisseria Gonorrhea: NEGATIVE

## 2018-05-26 LAB — HSV CULTURE AND TYPING

## 2019-09-29 ENCOUNTER — Other Ambulatory Visit: Payer: Self-pay

## 2019-09-29 ENCOUNTER — Encounter (HOSPITAL_COMMUNITY): Payer: Self-pay | Admitting: *Deleted

## 2019-09-29 ENCOUNTER — Emergency Department (HOSPITAL_COMMUNITY)
Admission: EM | Admit: 2019-09-29 | Discharge: 2019-09-29 | Disposition: A | Discharge status: Home / Self Care | Payer: Medicaid Other | Attending: Emergency Medicine | Admitting: Emergency Medicine

## 2019-09-29 DIAGNOSIS — N760 Acute vaginitis: Secondary | ICD-10-CM | POA: Insufficient documentation

## 2019-09-29 DIAGNOSIS — B9689 Other specified bacterial agents as the cause of diseases classified elsewhere: Secondary | ICD-10-CM | POA: Diagnosis not present

## 2019-09-29 DIAGNOSIS — Z79899 Other long term (current) drug therapy: Secondary | ICD-10-CM | POA: Diagnosis not present

## 2019-09-29 DIAGNOSIS — N898 Other specified noninflammatory disorders of vagina: Secondary | ICD-10-CM | POA: Diagnosis present

## 2019-09-29 DIAGNOSIS — A6009 Herpesviral infection of other urogenital tract: Secondary | ICD-10-CM | POA: Insufficient documentation

## 2019-09-29 DIAGNOSIS — F17210 Nicotine dependence, cigarettes, uncomplicated: Secondary | ICD-10-CM | POA: Insufficient documentation

## 2019-09-29 LAB — COMPREHENSIVE METABOLIC PANEL
ALT: 11 U/L (ref 0–44)
AST: 13 U/L — ABNORMAL LOW (ref 15–41)
Albumin: 3.6 g/dL (ref 3.5–5.0)
Alkaline Phosphatase: 54 U/L (ref 38–126)
Anion gap: 9 (ref 5–15)
BUN: 8 mg/dL (ref 6–20)
CO2: 24 mmol/L (ref 22–32)
Calcium: 9.7 mg/dL (ref 8.9–10.3)
Chloride: 104 mmol/L (ref 98–111)
Creatinine, Ser: 0.7 mg/dL (ref 0.44–1.00)
GFR calc Af Amer: >60 mL/min (ref >60)
GFR calc non Af Amer: >60 mL/min (ref >60)
Glucose, Bld: 97 mg/dL (ref 70–99)
Potassium: 3.8 mmol/L (ref 3.5–5.1)
Sodium: 137 mmol/L (ref 135–145)
Total Bilirubin: 0.5 mg/dL (ref 0.3–1.2)
Total Protein: 6.8 g/dL (ref 6.5–8.1)

## 2019-09-29 LAB — URINALYSIS, ROUTINE W REFLEX MICROSCOPIC
Bilirubin Urine: NEGATIVE
Glucose, UA: NEGATIVE mg/dL
Hgb urine dipstick: NEGATIVE
Ketones, ur: NEGATIVE mg/dL
Leukocytes,Ua: NEGATIVE
Nitrite: NEGATIVE
Protein, ur: NEGATIVE mg/dL
Specific Gravity, Urine: 1.002 — ABNORMAL LOW (ref 1.005–1.030)
pH: 8 (ref 5.0–8.0)

## 2019-09-29 LAB — WET PREP, GENITAL
Sperm: NONE SEEN
Trich, Wet Prep: NONE SEEN
Yeast Wet Prep HPF POC: NONE SEEN

## 2019-09-29 LAB — CBC
HCT: 32.9 % — ABNORMAL LOW (ref 36.0–46.0)
Hemoglobin: 9.2 g/dL — ABNORMAL LOW (ref 12.0–15.0)
MCH: 19.6 pg — ABNORMAL LOW (ref 26.0–34.0)
MCHC: 28 g/dL — ABNORMAL LOW (ref 30.0–36.0)
MCV: 70 fL — ABNORMAL LOW (ref 80.0–100.0)
Platelets: 176 10*3/uL (ref 150–400)
RBC: 4.7 MIL/uL (ref 3.87–5.11)
RDW: 19.2 % — ABNORMAL HIGH (ref 11.5–15.5)
WBC: 9.5 10*3/uL (ref 4.0–10.5)
nRBC: 0 % (ref 0.0–0.2)

## 2019-09-29 LAB — I-STAT BETA HCG BLOOD, ED (MC, WL, AP ONLY): I-stat hCG, quantitative: <5 m[IU]/mL (ref <5)

## 2019-09-29 LAB — LIPASE, BLOOD: Lipase: 23 U/L (ref 11–51)

## 2019-09-29 MED ORDER — METRONIDAZOLE 500 MG PO TABS: 500.0000 mg | ORAL_TABLET | Freq: Two times a day (BID) | ORAL | 0 refills | Status: AC

## 2019-09-29 MED ORDER — LIDOCAINE HCL (PF) 1 % IJ SOLN
INTRAMUSCULAR | Status: AC
  Filled 2019-09-29: qty 5

## 2019-09-29 MED ORDER — VALACYCLOVIR HCL 1 G PO TABS: 1000.0000 mg | ORAL_TABLET | Freq: Two times a day (BID) | ORAL | 0 refills | Status: AC

## 2019-09-29 MED ORDER — SODIUM CHLORIDE 0.9% FLUSH: 3.0000 mL | Freq: Once | INTRAVENOUS | Status: DC

## 2019-09-29 MED ORDER — CEFTRIAXONE SODIUM 500 MG IJ SOLR
500.0000 mg | Freq: Once | INTRAMUSCULAR | Status: AC
  Administered 2019-09-29: 500 mg via INTRAMUSCULAR
  Filled 2019-09-29: qty 500

## 2019-09-29 NOTE — ED Provider Notes (Signed)
[place] EMERGENCY DEPARTMENT Provider Note   CSN: [id number] Arrival date & time: 09/29/19  1553     History Chief Complaint  Patient presents with  . Abdominal Pain  . Vaginal Discharge    [name] is a 49 y.o. female.  The history is provided by the patient. No language interpreter was used.  Abdominal Pain Associated symptoms: vaginal discharge   Vaginal Discharge Associated symptoms: abdominal pain      49 year old female presenting complaining of abdominal discomfort.  Patient report for the past 2 weeks she has had lower abdominal discomfort and follows with vaginal discharge and strong foul odor.  For the past week she also noticed rash to her vaginal region similar to prior herpes outbreak.  Reports stinging burning sensation with itchiness to the affected area.  She also complaining of urinary frequency and urgency for the past 4 days.  She does not chills without any fever.  She did felt nauseous and vomited once today.  No chest pain or shortness of breath.  She denies any vaginal bleeding.  Her last menstrual period was approximately a week ago.  She denies any new sexual partners.  She denies any specific treatment tried.  She denies any change in her feminine product detergents or soap.  She endorsed moderate stinging burning sensation to her vaginal region.  Past Medical History:  Diagnosis Date  . Allergy   . Anemia   . Arthritis   . Fibroid   . Frequent headaches   . GERD (gastroesophageal reflux disease)   . Migraines   . Urinary tract infection     Patient Active Problem List   Diagnosis Date Noted  . Herpes genitalis in women 05/24/2018  . Vaginal irritation 05/24/2018  . HNP (herniated nucleus pulposus), lumbar 11/19/2016    Past Surgical History:  Procedure Laterality Date  . LUMBAR LAMINECTOMY/DECOMPRESSION MICRODISCECTOMY Right 11/19/2016   Procedure: Right Lumbar Four-Five Microdiscectomy;  Surgeon: Kary Kos, MD;   Location: [place];  Service: Neurosurgery;  Laterality: Right;  . TUBAL LIGATION       OB History    Gravida  7   Para  7   Term  7   Preterm  0   AB  0   Living  7     SAB  0   TAB  0   Ectopic  0   Multiple  0   Live Births  7           Family History  Problem Relation Age of Onset  . Cancer Mother        breast ca  . Diabetes Sister   . Cancer Maternal Aunt        colon  . Cancer Maternal Grandmother        uterine  . Hyperlipidemia Father   . Diabetes Paternal Grandmother     Social History   Tobacco Use  . Smoking status: Current Some Day Smoker    Types: Cigarettes    Last attempt to quit: 12/06/2013    Years since quitting: 5.8  . Smokeless tobacco: Never Used  Vaping Use  . Vaping Use: Never used  Substance Use Topics  . Alcohol use: Not Currently    Comment: occ  . Drug use: No    Home Medications Prior to Admission medications   Medication Sig Start Date End Date Taking? Authorizing Provider  nabumetone (RELAFEN) 500 MG tablet Take 1 tablet (500 mg total) by mouth  2 (two) times daily. Patient not taking: Reported on 11/11/2016 09/18/16   Magnus Sinning, MD  valACYclovir (VALTREX) 1000 MG tablet Take 1 tablet (1,000 mg total) by mouth 2 (two) times daily. 05/24/18   Laury Deep, CNM    Allergies    Patient has no known allergies.  Review of Systems   Review of Systems  Gastrointestinal: Positive for abdominal pain.  Genitourinary: Positive for vaginal discharge.  All other systems reviewed and are negative.   Physical Exam Updated Vital Signs BP 126/79 (BP Location: Left Arm)   Pulse 82   Temp 98.6 F (37 C) (Oral)   Resp 18   Ht 5\' 1"  (1.549 m)   Wt 74.8 kg   LMP 09/22/2019   SpO2 100%   BMI 31.18 kg/m   Physical Exam Vitals and nursing note reviewed. Exam conducted with a chaperone present.  Constitutional:      General: She is not in acute distress.    Appearance: She is well-developed.  HENT:     Head:  Atraumatic.  Eyes:     Conjunctiva/sclera: Conjunctivae normal.  Cardiovascular:     Rate and Rhythm: Normal rate and regular rhythm.  Pulmonary:     Effort: Pulmonary effort is normal.     Breath sounds: Normal breath sounds.  Abdominal:     General: Abdomen is flat.     Palpations: Abdomen is soft.     Tenderness: There is no abdominal tenderness.  Genitourinary:    Comments: Chaperone present during exam.  No inguinal lymphadenopathy or inguinal hernia noted.  3 small blisters noted to left labia minora mildly tender to palpation.  No significant discomfort with speculum insertion.  Mild white vaginal discharge noted.  Close cervical os free of lesion or rash.  On bimanual examination, mild right adnexal tenderness but no cervical motion tenderness. Musculoskeletal:     Cervical back: Neck supple.  Skin:    Findings: No rash.  Neurological:     Mental Status: She is alert.     ED Results / Procedures / Treatments   Labs (all labs ordered are listed, but only abnormal results are displayed) Labs Reviewed  WET PREP, GENITAL - Abnormal; Notable for the following components:      Result Value   Clue Cells Wet Prep HPF POC PRESENT (*)    WBC, Wet Prep HPF POC FEW (*)    All other components within normal limits  COMPREHENSIVE METABOLIC PANEL - Abnormal; Notable for the following components:   AST 13 (*)    All other components within normal limits  CBC - Abnormal; Notable for the following components:   Hemoglobin 9.2 (*)    HCT 32.9 (*)    MCV 70.0 (*)    MCH 19.6 (*)    MCHC 28.0 (*)    RDW 19.2 (*)    All other components within normal limits  URINALYSIS, ROUTINE W REFLEX MICROSCOPIC - Abnormal; Notable for the following components:   Color, Urine COLORLESS (*)    Specific Gravity, Urine 1.002 (*)    All other components within normal limits  LIPASE, BLOOD  RPR  HIV ANTIBODY (ROUTINE TESTING W REFLEX)  I-STAT BETA HCG BLOOD, ED ([facility], [facility], [facility] ONLY)  GC/CHLAMYDIA PROBE  AMP ([place]) NOT AT [facility]    EKG None  Radiology No results found.  Procedures Procedures (including critical care time)  Medications Ordered in ED Medications  sodium chloride flush (NS) 0.9 % injection 3 mL (has no administration  in time range)  lidocaine (PF) (XYLOCAINE) 1 % injection (has no administration in time range)  cefTRIAXone (ROCEPHIN) injection 500 mg (500 mg Intramuscular Given 09/29/19 2222)    ED Course  I have reviewed the triage vital signs and the nursing notes.  Pertinent labs & imaging results that were available during my care of the patient were reviewed by me and considered in my medical decision making (see chart for details).    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  She also complaining of rash similar to prior herpes outbreak.  She has a very benign abdominal exam.  Will perform pelvic examination.   9:30 PM Patient has 3 small possible vesicular lesions noted to her left labial minora.  She has been right adnexal tenderness but no cervical motion tenderness.  Doubt PID.  In the setting of prior herpes infection, will treat with acyclovir.  UA without signs of urinary tract infection.  Pregnancy test is negative.  Rocephin given for STI  prophylaxis.  10:46 PM Wet prep with presence of clue cells.  In the setting of foul odor, and vaginal discomfort along with clue cells, will treat for BV with Flagyl.  Will discharge home with Valtrex for herpes simplex.  Pregnancy test is negative.  Return precaution discussed.   Domenic Moras, PA-C 09/29/19 2249    Hayden Rasmussen, MD 09/30/19 1016

## 2019-09-29 NOTE — Discharge Instructions (Signed)
Take Valtrex as treatment of genital herpes.  Take Flagyl as treatment of bacterial vaginosis.  Avoid alcohol use while taking medication as it can cause sickness.  Return if you have any concerns.

## 2019-09-29 NOTE — ED Notes (Signed)
Patient verbalizes understanding of discharge instructions. Opportunity for questioning and answers were provided. Armband removed by staff, pt discharged from ED ambulatory.   

## 2019-09-29 NOTE — ED Triage Notes (Signed)
Pt reports having abd pain with foul smelling vaginal discharge and also reports having recent herpes outbreak. No distress noted at triage.

## 2019-09-30 LAB — HIV ANTIBODY (ROUTINE TESTING W REFLEX): HIV Screen 4th Generation wRfx: NONREACTIVE

## 2019-09-30 LAB — GC/CHLAMYDIA PROBE AMP (~~LOC~~) NOT AT ARMC
Chlamydia: NEGATIVE
Comment: NEGATIVE
Comment: NORMAL
Neisseria Gonorrhea: NEGATIVE

## 2019-09-30 LAB — RPR: RPR Ser Ql: NONREACTIVE

## 2020-11-13 ENCOUNTER — Emergency Department (HOSPITAL_BASED_OUTPATIENT_CLINIC_OR_DEPARTMENT_OTHER)
Admission: EM | Admit: 2020-11-13 | Discharge: 2020-11-14 | Disposition: A | Discharge status: Home / Self Care | Payer: Medicaid Other | Attending: Emergency Medicine | Admitting: Emergency Medicine

## 2020-11-13 ENCOUNTER — Encounter (HOSPITAL_BASED_OUTPATIENT_CLINIC_OR_DEPARTMENT_OTHER): Payer: Self-pay | Admitting: Emergency Medicine

## 2020-11-13 ENCOUNTER — Other Ambulatory Visit: Payer: Self-pay

## 2020-11-13 DIAGNOSIS — K219 Gastro-esophageal reflux disease without esophagitis: Secondary | ICD-10-CM | POA: Diagnosis not present

## 2020-11-13 DIAGNOSIS — F17210 Nicotine dependence, cigarettes, uncomplicated: Secondary | ICD-10-CM | POA: Insufficient documentation

## 2020-11-13 DIAGNOSIS — M5441 Lumbago with sciatica, right side: Secondary | ICD-10-CM | POA: Insufficient documentation

## 2020-11-13 DIAGNOSIS — M5136 Other intervertebral disc degeneration, lumbar region: Secondary | ICD-10-CM | POA: Diagnosis not present

## 2020-11-13 DIAGNOSIS — R1031 Right lower quadrant pain: Secondary | ICD-10-CM | POA: Diagnosis not present

## 2020-11-13 NOTE — ED Triage Notes (Signed)
Pt reports intermittent lower back pain that has been going on for the past several weeks along with "right butt cheek pain"; also reports "a lot of weight loss and I can tell because my lips are cracked'; pt also reports "my intestines fell out in the toilet last night, though it looks like worms" and pt has bag of contents from the toilet; pt also reports intermittent lower abd pain starting with the back pain as well; pt continues to list complaints

## 2020-11-13 NOTE — ED Provider Notes (Signed)
[facility] EMERGENCY DEPARTMENT Provider Note   CSN: [id number] Arrival date & time: 11/13/20  2138     History Chief Complaint  Patient presents with   multiple complaints    [name] is a 50 y.o. female.  HPI     50 year old female with a history of allergy, anemia, fibroids, GERD, migraines, lumbar laminectomy/microdiscectomy in 2019, presents with concern for lower back pain, buttock pain, abdominal pain.  Right buttock pain, back pain began about 3 weeks ago and lower back pain. No falls or trauma.  Goes down right leg. Feels like something weighing her down, things drooping down, seems like something pulling body downward. Restless legs, especially right.  When driving back pain started.  No loss of control of bowel or bladder, not numb or weak. No fevers.  No hx of IVDU or cancer.  [place].  Sometimes improves with BM.    Abdominal pain with eating, sharp pain, different places, for last few weeks.  Avoiding eating, filling up fast.  No n/v now but had before.  Has not had any diarrhea. Did have bowel movement and something was in toilet, long white, looked like a worm but wasn't a worm last night-thought maybe it was intestine. Put in bag last night, looked dried up then put water and it went away  Past Medical History:  Diagnosis Date   Allergy    Anemia    Arthritis    Fibroid    Frequent headaches    GERD (gastroesophageal reflux disease)    Migraines    Urinary tract infection     Patient Active Problem List   Diagnosis Date Noted   Herpes genitalis in women 05/24/2018   Vaginal irritation 05/24/2018   HNP (herniated nucleus pulposus), lumbar 11/19/2016    Past Surgical History:  Procedure Laterality Date   LUMBAR LAMINECTOMY/DECOMPRESSION MICRODISCECTOMY Right 11/19/2016   Procedure: Right Lumbar Four-Five Microdiscectomy;  Surgeon: Kary Kos, MD;  Location: [facility];  Service: Neurosurgery;  Laterality: Right;   TUBAL LIGATION        OB History     Gravida  7   Para  7   Term  7   Preterm  0   AB  0   Living  7      SAB  0   IAB  0   Ectopic  0   Multiple  0   Live Births  7           Family History  Problem Relation Age of Onset   Cancer Mother        breast ca   Diabetes Sister    Cancer Maternal Aunt        colon   Cancer Maternal Grandmother        uterine   Hyperlipidemia Father    Diabetes Paternal Grandmother     Social History   Tobacco Use   Smoking status: Some Days    Types: Cigarettes    Last attempt to quit: 12/06/2013    Years since quitting: 6.9   Smokeless tobacco: Never  Vaping Use   Vaping Use: Never used  Substance Use Topics   Alcohol use: Not Currently    Comment: occ   Drug use: No    Home Medications Prior to Admission medications   Medication Sig Start Date End Date Taking? Authorizing Provider  cyclobenzaprine (FLEXERIL) 10 MG tablet Take 1 tablet (10 mg total) by mouth 2 (two) times daily as  needed for muscle spasms. 11/14/20  Yes Gareth Morgan, MD  pantoprazole (PROTONIX) 20 MG tablet Take 2 tablets (40 mg total) by mouth daily for 14 days. 11/14/20 11/28/20 Yes Gareth Morgan, MD  predniSONE (DELTASONE) 10 MG tablet Take 4 tablets (40 mg total) by mouth daily for 5 days. 11/14/20 11/19/20 Yes Gareth Morgan, MD  metroNIDAZOLE (FLAGYL) 500 MG tablet Take 1 tablet (500 mg total) by mouth 2 (two) times daily. One po bid x 7 days 09/29/19   Domenic Moras, PA-C  nabumetone (RELAFEN) 500 MG tablet Take 1 tablet (500 mg total) by mouth 2 (two) times daily. Patient not taking: Reported on 11/11/2016 09/18/16   Magnus Sinning, MD  naproxen sodium (ALEVE) 220 MG tablet Take 220 mg by mouth 2 (two) times daily as needed (pain).    [provider]  valACYclovir (VALTREX) 1000 MG tablet Take 1 tablet (1,000 mg total) by mouth 2 (two) times daily. 09/29/19   Domenic Moras, PA-C    Allergies    Patient has no known allergies.  Review of Systems    Review of Systems  Constitutional:  Negative for fever.  HENT:  Negative for sore throat.   Eyes:  Negative for visual disturbance.  Respiratory:  Negative for cough and shortness of breath.   Cardiovascular:  Negative for chest pain.  Gastrointestinal:  Positive for abdominal pain. Negative for diarrhea and vomiting.  Genitourinary:  Negative for difficulty urinating.  Musculoskeletal:  Negative for back pain and neck pain.  Skin:  Negative for rash.  Neurological:  Negative for syncope and headaches.   Physical Exam Updated Vital Signs BP 130/79   Pulse 65   Temp 98.6 F (37 C) (Oral)   Resp 17   Ht '5\' 3"'$  (1.6 m)   Wt 68 kg   SpO2 100%   BMI 26.57 kg/m   Physical Exam Vitals and nursing note reviewed.  Constitutional:      General: She is not in acute distress.    Appearance: She is well-developed. She is not diaphoretic.  HENT:     Head: Normocephalic and atraumatic.  Eyes:     Conjunctiva/sclera: Conjunctivae normal.  Cardiovascular:     Rate and Rhythm: Normal rate and regular rhythm.     Heart sounds: Normal heart sounds. No murmur heard.   No friction rub. No gallop.  Pulmonary:     Effort: Pulmonary effort is normal. No respiratory distress.     Breath sounds: Normal breath sounds. No wheezing or rales.  Abdominal:     General: There is no distension.     Palpations: Abdomen is soft.     Tenderness: There is abdominal tenderness (RLQ). There is no guarding.  Musculoskeletal:        General: No tenderness.     Cervical back: Normal range of motion.  Skin:    General: Skin is warm and dry.     Findings: No erythema or rash.  Neurological:     Mental Status: She is alert and oriented to person, place, and time.     Sensory: No sensory deficit.     Motor: No weakness.    ED Results / Procedures / Treatments   Labs (all labs ordered are listed, but only abnormal results are displayed) Labs Reviewed  CBC WITH DIFFERENTIAL/PLATELET - Abnormal; Notable  for the following components:      Result Value   Hemoglobin 8.6 (*)    HCT 30.9 (*)    MCV 64.0 (*)  MCH 17.8 (*)    MCHC 27.8 (*)    RDW 21.5 (*)    All other components within normal limits  COMPREHENSIVE METABOLIC PANEL - Abnormal; Notable for the following components:   Potassium 3.2 (*)    Glucose, Bld 100 (*)    BUN 5 (*)    Total Bilirubin 0.2 (*)    All other components within normal limits  URINALYSIS, ROUTINE W REFLEX MICROSCOPIC - Abnormal; Notable for the following components:   Specific Gravity, Urine <1.005 (*)    Hgb urine dipstick SMALL (*)    Leukocytes,Ua TRACE (*)    All other components within normal limits  URINALYSIS, MICROSCOPIC (REFLEX) - Abnormal; Notable for the following components:   Bacteria, UA FEW (*)    All other components within normal limits  LIPASE, BLOOD  PREGNANCY, URINE    EKG None  Radiology CT ABDOMEN PELVIS W CONTRAST  Result Date: 11/14/2020 CLINICAL DATA:  Right-sided abdominal pain EXAM: CT ABDOMEN AND PELVIS WITH CONTRAST TECHNIQUE: Multidetector CT imaging of the abdomen and pelvis was performed using the standard protocol following bolus administration of intravenous contrast. CONTRAST:  55m OMNIPAQUE IOHEXOL 300 MG/ML  SOLN COMPARISON:  07/09/2016 FINDINGS: Lower chest: No acute abnormality. Hepatobiliary: Mild fatty infiltration of the liver is noted. The gallbladder is within normal limits. Pancreas: Unremarkable. No pancreatic ductal dilatation or surrounding inflammatory changes. Spleen: Normal in size without focal abnormality. Adrenals/Urinary Tract: Adrenal glands are within normal limits. Kidneys demonstrate a normal enhancement pattern bilaterally. No renal calculi are seen. Normal excretion is noted on delayed images. The ureters are within normal limits. The bladder is well distended. Stomach/Bowel: No obstructive or inflammatory changes of the colon are seen. The appendix is well visualized and within normal limits.  Small bowel and stomach are unremarkable. Vascular/Lymphatic: Aortic atherosclerosis. No enlarged abdominal or pelvic lymph nodes. Reproductive: Uterus and bilateral adnexa are unremarkable. Discrete fibroid is not well appreciated on this exam. Other: No abdominal wall hernia or abnormality. No abdominopelvic ascites. Musculoskeletal: Degenerative changes of lumbar spine are noted. Mild anterolisthesis of L4 on L5 is noted. IMPRESSION: Normal-appearing appendix. Fatty liver. Electronically Signed   By: MInez CatalinaM.D.   On: 11/14/2020 01:31    Procedures Procedures   Medications Ordered in ED Medications  lactated ringers bolus 1,000 mL (0 mLs Intravenous Stopped 11/14/20 0204)  ondansetron (ZOFRAN) injection 4 mg (4 mg Intravenous Given 11/14/20 0042)  ketorolac (TORADOL) 30 MG/ML injection 30 mg (30 mg Intravenous Given 11/14/20 0042)  iohexol (OMNIPAQUE) 300 MG/ML solution 75 mL (75 mLs Intravenous Contrast Given 11/14/20 0115)  potassium chloride SA (KLOR-CON) CR tablet 40 mEq (40 mEq Oral Given 11/14/20 0212)    ED Course  I have reviewed the triage vital signs and the nursing notes.  Pertinent labs & imaging results that were available during my care of the patient were reviewed by me and considered in my medical decision making (see chart for details).    MDM Rules/Calculators/A&P                            50year old female with a history of allergy, anemia, fibroids, GERD, migraines, lumbar laminectomy/microdiscectomy in 2019, presents with concern for lower back pain, buttock pain, abdominal pain.  Given right-sided abdominal tenderness on exam, CT abdomen pelvis was evaluated.  CT shows normal-appearing appendix, without other acute abnormalities, however does show degenerative changes of the lumbar spine with mild anterolisthesis on L4  and L5.  Has had similar anemia in the past.  Potassium 3.2 and given replacement.  Describes concern of "passing intestines", however by history  sounds more consistent with worms or mucus.  She had added water to dried up specimen in a bag, and at this time do not see presence of it, and looks more like remnants of mucus.  Recommend continue monitoring symptoms,may follow up as needed--given number for gastroenterology.    Suspect that back, buttock pain and radicular pain secondary to degenerative disc disease.  Given rx for prednisone, flexeril, and rx for PPI given abdominal pain.  Recommend follow up with her [facility] physician. Patient discharged in stable condition with understanding of reasons to return.    Final Clinical Impression(s) / ED Diagnoses Final diagnoses:  Right lower quadrant abdominal pain  Acute right-sided low back pain with right-sided sciatica  Degenerative disc disease, lumbar    Rx / DC Orders ED Discharge Orders          Ordered    predniSONE (DELTASONE) 10 MG tablet  Daily        11/14/20 0203    cyclobenzaprine (FLEXERIL) 10 MG tablet  2 times daily PRN        11/14/20 0203    pantoprazole (PROTONIX) 20 MG tablet  Daily        11/14/20 [id number]             Gareth Morgan, MD 11/15/20 [phone]

## 2020-11-14 ENCOUNTER — Emergency Department (HOSPITAL_BASED_OUTPATIENT_CLINIC_OR_DEPARTMENT_OTHER): Payer: Medicaid Other

## 2020-11-14 LAB — URINALYSIS, ROUTINE W REFLEX MICROSCOPIC
Bilirubin Urine: NEGATIVE
Glucose, UA: NEGATIVE mg/dL
Ketones, ur: NEGATIVE mg/dL
Nitrite: NEGATIVE
Protein, ur: NEGATIVE mg/dL
Specific Gravity, Urine: <1.005 — ABNORMAL LOW (ref 1.005–1.030)
pH: 6 (ref 5.0–8.0)

## 2020-11-14 LAB — URINALYSIS, MICROSCOPIC (REFLEX)

## 2020-11-14 LAB — CBC WITH DIFFERENTIAL/PLATELET
Abs Immature Granulocytes: 0.03 10*3/uL (ref 0.00–0.07)
Basophils Absolute: 0.1 10*3/uL (ref 0.0–0.1)
Basophils Relative: 1 %
Eosinophils Absolute: 0.1 10*3/uL (ref 0.0–0.5)
Eosinophils Relative: 2 %
HCT: 30.9 % — ABNORMAL LOW (ref 36.0–46.0)
Hemoglobin: 8.6 g/dL — ABNORMAL LOW (ref 12.0–15.0)
Immature Granulocytes: 0 %
Lymphocytes Relative: 36 %
Lymphs Abs: 3 10*3/uL (ref 0.7–4.0)
MCH: 17.8 pg — ABNORMAL LOW (ref 26.0–34.0)
MCHC: 27.8 g/dL — ABNORMAL LOW (ref 30.0–36.0)
MCV: 64 fL — ABNORMAL LOW (ref 80.0–100.0)
Monocytes Absolute: 0.8 10*3/uL (ref 0.1–1.0)
Monocytes Relative: 10 %
Neutro Abs: 4.2 10*3/uL (ref 1.7–7.7)
Neutrophils Relative %: 51 %
Platelets: 161 10*3/uL (ref 150–400)
RBC: 4.83 MIL/uL (ref 3.87–5.11)
RDW: 21.5 % — ABNORMAL HIGH (ref 11.5–15.5)
Smear Review: NORMAL
WBC: 8.3 10*3/uL (ref 4.0–10.5)
nRBC: 0 % (ref 0.0–0.2)

## 2020-11-14 LAB — COMPREHENSIVE METABOLIC PANEL
ALT: 10 U/L (ref 0–44)
AST: 16 U/L (ref 15–41)
Albumin: 4 g/dL (ref 3.5–5.0)
Alkaline Phosphatase: 60 U/L (ref 38–126)
Anion gap: 10 (ref 5–15)
BUN: 5 mg/dL — ABNORMAL LOW (ref 6–20)
CO2: 28 mmol/L (ref 22–32)
Calcium: 9.6 mg/dL (ref 8.9–10.3)
Chloride: 100 mmol/L (ref 98–111)
Creatinine, Ser: 0.67 mg/dL (ref 0.44–1.00)
GFR, Estimated: >60 mL/min (ref >60)
Glucose, Bld: 100 mg/dL — ABNORMAL HIGH (ref 70–99)
Potassium: 3.2 mmol/L — ABNORMAL LOW (ref 3.5–5.1)
Sodium: 138 mmol/L (ref 135–145)
Total Bilirubin: 0.2 mg/dL — ABNORMAL LOW (ref 0.3–1.2)
Total Protein: 7.6 g/dL (ref 6.5–8.1)

## 2020-11-14 LAB — LIPASE, BLOOD: Lipase: 24 U/L (ref 11–51)

## 2020-11-14 LAB — PREGNANCY, URINE: Preg Test, Ur: NEGATIVE

## 2020-11-14 MED ORDER — IOHEXOL 300 MG/ML  SOLN
75.0000 mL | Freq: Once | INTRAMUSCULAR | Status: AC | PRN
  Administered 2020-11-14: 75 mL via INTRAVENOUS

## 2020-11-14 MED ORDER — POTASSIUM CHLORIDE CRYS ER 20 MEQ PO TBCR
40.0000 meq | EXTENDED_RELEASE_TABLET | Freq: Once | ORAL | Status: AC
  Administered 2020-11-14: 40 meq via ORAL
  Filled 2020-11-14: qty 2

## 2020-11-14 MED ORDER — KETOROLAC TROMETHAMINE 30 MG/ML IJ SOLN
30.0000 mg | Freq: Once | INTRAMUSCULAR | Status: AC
  Administered 2020-11-14: 30 mg via INTRAVENOUS
  Filled 2020-11-14: qty 1

## 2020-11-14 MED ORDER — PREDNISONE 10 MG PO TABS
40.0000 mg | ORAL_TABLET | Freq: Every day | ORAL | 0 refills | Status: AC
Start: 2020-11-14 — End: 2020-11-19

## 2020-11-14 MED ORDER — PANTOPRAZOLE SODIUM 20 MG PO TBEC
40.0000 mg | DELAYED_RELEASE_TABLET | Freq: Every day | ORAL | 0 refills | Status: AC
Start: 2020-11-14 — End: 2020-11-28

## 2020-11-14 MED ORDER — CYCLOBENZAPRINE HCL 10 MG PO TABS: 10.0000 mg | ORAL_TABLET | Freq: Two times a day (BID) | ORAL | 0 refills | Status: AC | PRN

## 2020-11-14 MED ORDER — LACTATED RINGERS IV BOLUS
1000.0000 mL | Freq: Once | INTRAVENOUS | Status: AC
  Administered 2020-11-14: 1000 mL via INTRAVENOUS

## 2020-11-14 MED ORDER — ONDANSETRON HCL 4 MG/2ML IJ SOLN
4.0000 mg | Freq: Once | INTRAMUSCULAR | Status: AC
  Administered 2020-11-14: 4 mg via INTRAVENOUS
  Filled 2020-11-14: qty 2

## 2020-11-14 NOTE — ED Notes (Signed)
Pt tolerated PO.

## 2020-11-14 NOTE — ED Notes (Signed)
Pt to CT

## 2022-03-18 IMAGING — CT CT ABD-PELV W/ CM
2 of 6 series · 17 of 46 positions shown, 19 images · IV contrast (omnipaque)
Comparison: 07/09/2016

CLINICAL DATA: Right-sided abdominal pain

EXAM:
CT ABDOMEN AND PELVIS WITH CONTRAST
TECHNIQUE: Multidetector CT imaging of the abdomen and pelvis was performed
using the standard protocol following bolus administration of
intravenous contrast.
CONTRAST:  75mL OMNIPAQUE IOHEXOL 300 MG/ML  SOLN

[Series 3: axial st · axial · 0.96mm/px · z∈[+794,+1158]mm · 14 of 83 slices shown, 16 images]
[im 5/83  soft-tissue]
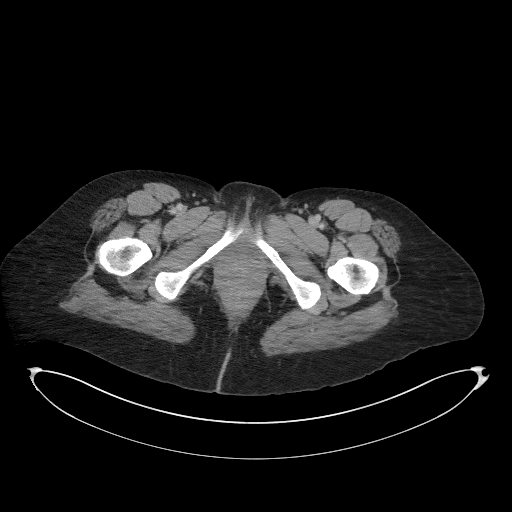
[im 5/83  bone]
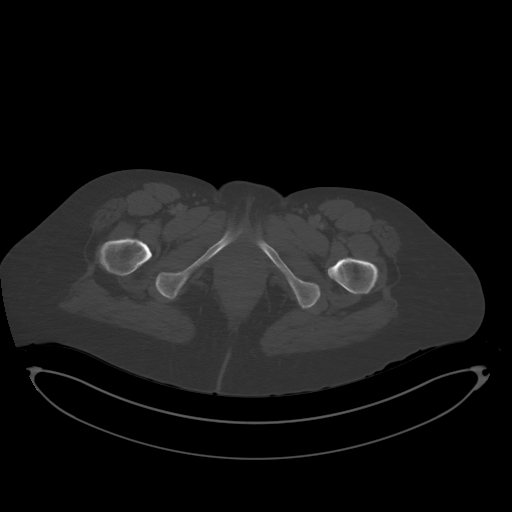
[im 9/83  soft-tissue]
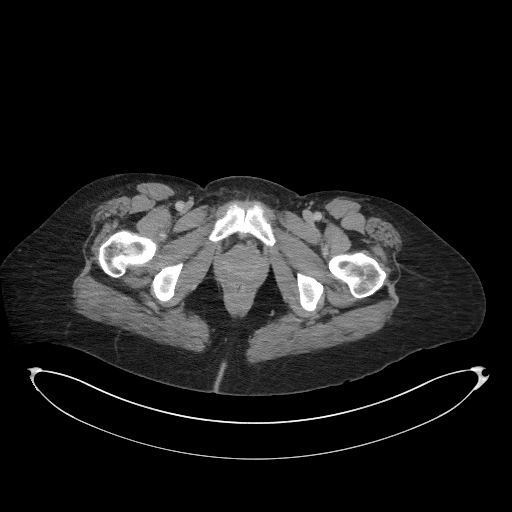
[im 18/83  soft-tissue]
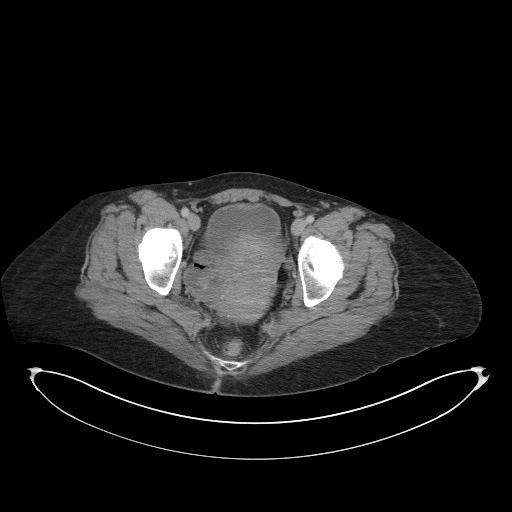
[im 22/83  soft-tissue]
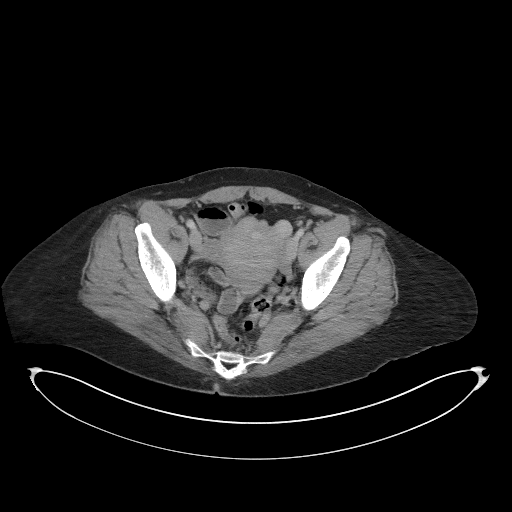
[im 26/83  soft-tissue]
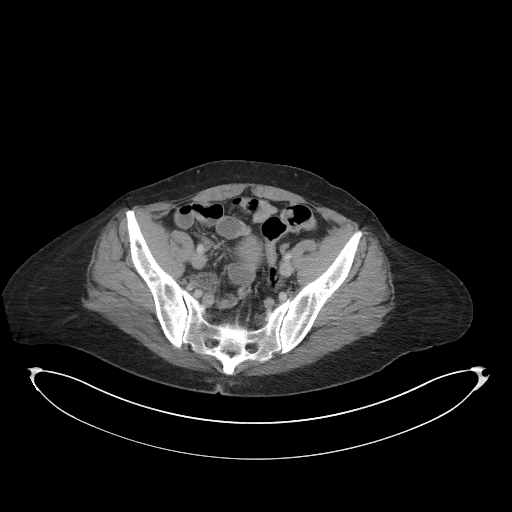
[im 35/83  soft-tissue]
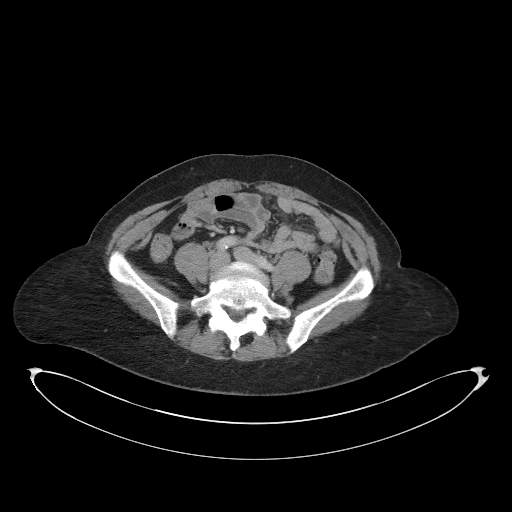
[im 39/83  soft-tissue]
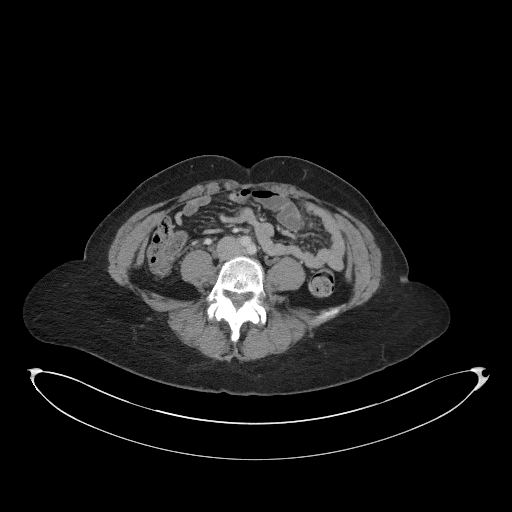
[im 44/83  soft-tissue]
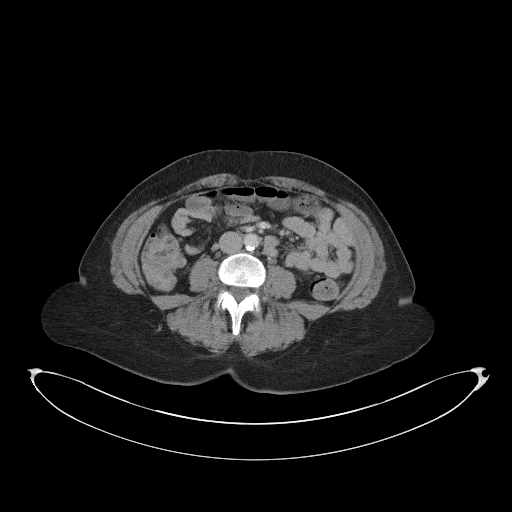
[im 48/83  soft-tissue]
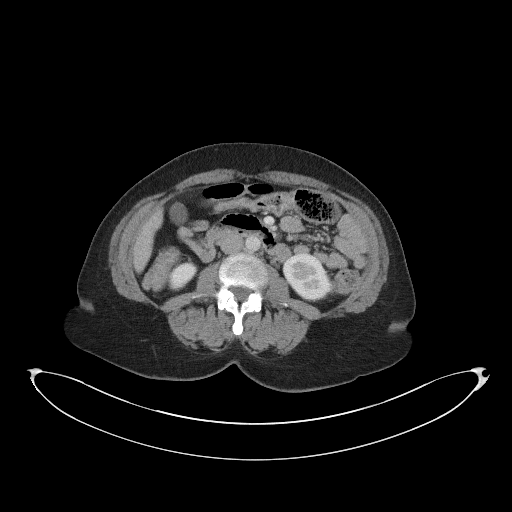
[im 48/83  bone]
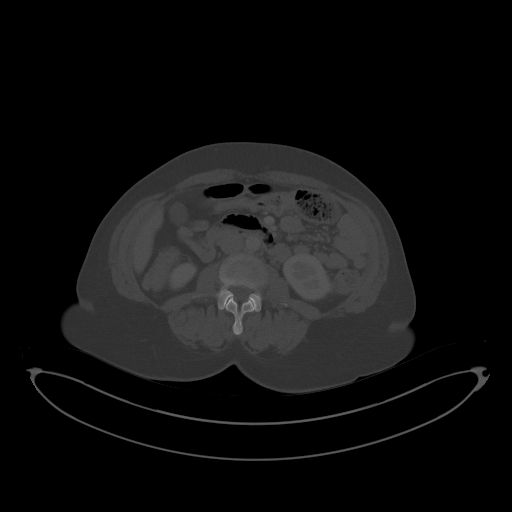
[im 57/83  soft-tissue]
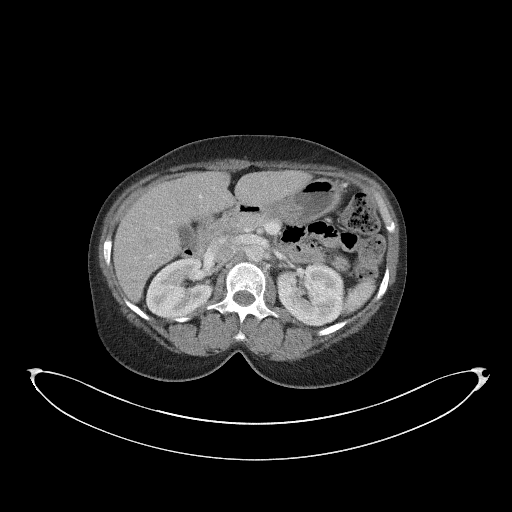
[im 61/83  soft-tissue]
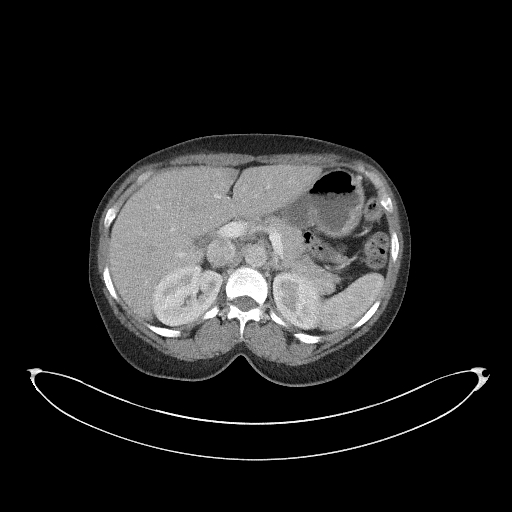
[im 65/83  soft-tissue]
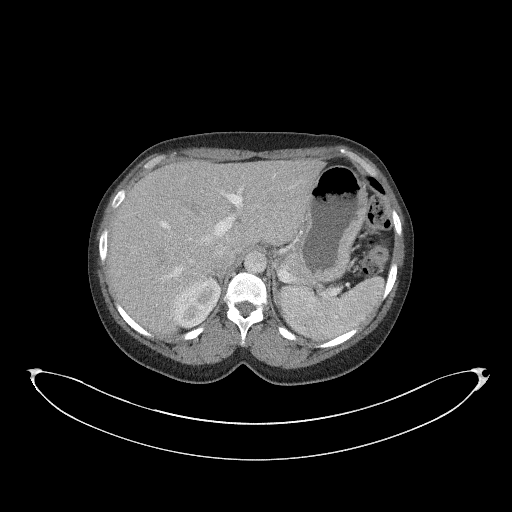
[im 74/83  soft-tissue]
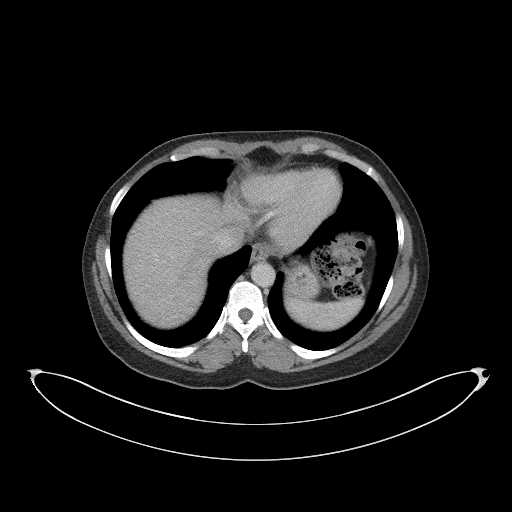
[im 78/83  soft-tissue]
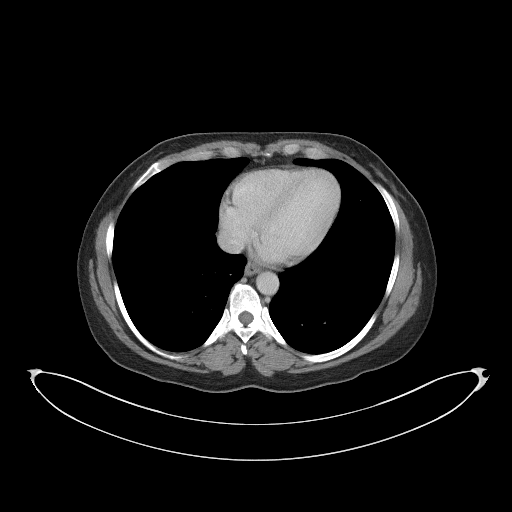

[Series 6: coronal st · coronal · 0.72mm/px · 3 of 84 slices shown]
[im 28/84  soft-tissue]
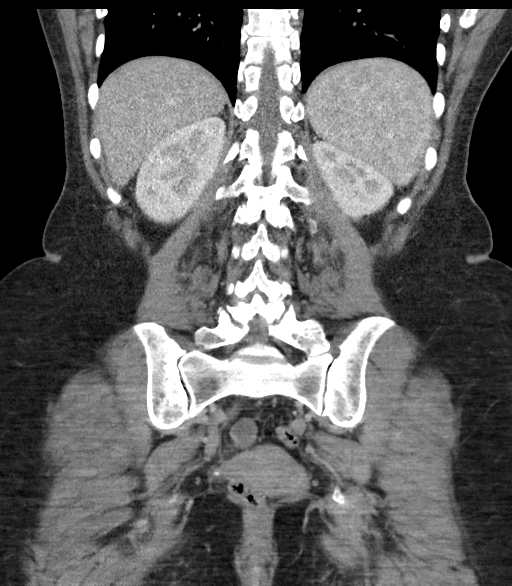
[im 37/84  soft-tissue]
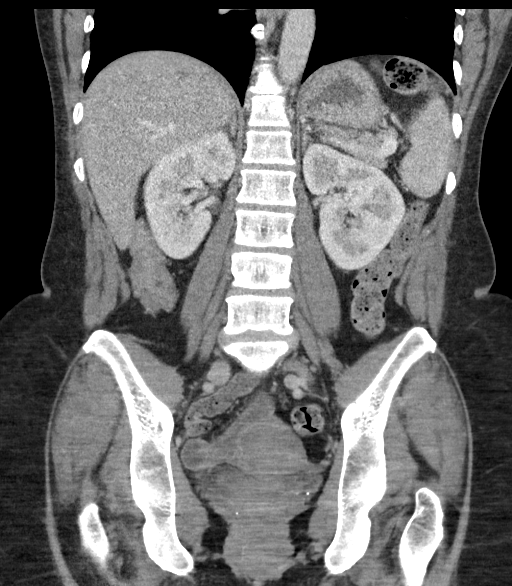
[im 47/84  soft-tissue]
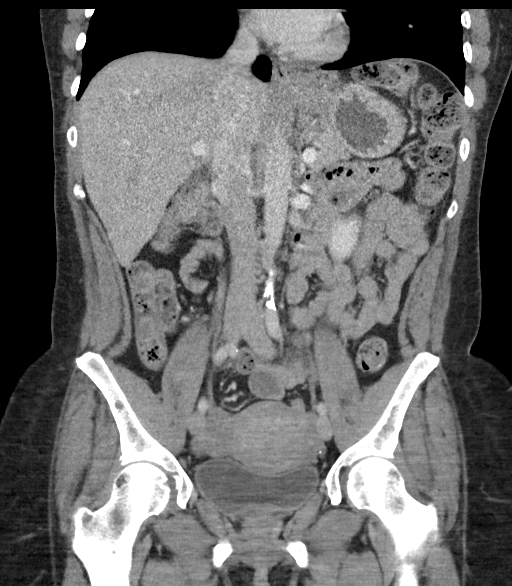

[17 of 46 positions shown; findings below may reference images not displayed]

FINDINGS: Lower chest: No acute abnormality.

Hepatobiliary: Mild fatty infiltration of the liver is noted. The
gallbladder is within normal limits.

Pancreas: Unremarkable. No pancreatic ductal dilatation or
surrounding inflammatory changes.

Spleen: Normal in size without focal abnormality.

Adrenals/Urinary Tract: Adrenal glands are within normal limits.
Kidneys demonstrate a normal enhancement pattern bilaterally. No
renal calculi are seen. Normal excretion is noted on delayed images.
The ureters are within normal limits. The bladder is well distended.

Stomach/Bowel: No obstructive or inflammatory changes of the colon
are seen. The appendix is well visualized and within normal limits.
Small bowel and stomach are unremarkable.

Vascular/Lymphatic: Aortic atherosclerosis. No enlarged abdominal or
pelvic lymph nodes.

Reproductive: Uterus and bilateral adnexa are unremarkable. Discrete
fibroid is not well appreciated on this exam.

Other: No abdominal wall hernia or abnormality. No abdominopelvic
ascites.

Musculoskeletal: Degenerative changes of lumbar spine are noted.
Mild anterolisthesis of L4 on L5 is noted.
IMPRESSION: Normal-appearing appendix.

Fatty liver.

## 2022-09-10 ENCOUNTER — Emergency Department (HOSPITAL_COMMUNITY)
Admission: EM | Admit: 2022-09-10 | Discharge: 2022-09-11 | Disposition: A | Discharge status: Home / Self Care | Payer: Medicaid Other | Attending: Emergency Medicine | Admitting: Emergency Medicine

## 2022-09-10 ENCOUNTER — Encounter (HOSPITAL_COMMUNITY): Payer: Self-pay

## 2022-09-10 ENCOUNTER — Other Ambulatory Visit: Payer: Self-pay

## 2022-09-10 DIAGNOSIS — L0291 Cutaneous abscess, unspecified: Secondary | ICD-10-CM

## 2022-09-10 DIAGNOSIS — N764 Abscess of vulva: Secondary | ICD-10-CM | POA: Insufficient documentation

## 2022-09-10 LAB — CBC WITH DIFFERENTIAL/PLATELET
Abs Immature Granulocytes: 0.02 10*3/uL (ref 0.00–0.07)
Basophils Absolute: 0.1 10*3/uL (ref 0.0–0.1)
Basophils Relative: 1 %
Eosinophils Absolute: 0.2 10*3/uL (ref 0.0–0.5)
Eosinophils Relative: 2 %
HCT: 33.3 % — ABNORMAL LOW (ref 36.0–46.0)
Hemoglobin: 10.2 g/dL — ABNORMAL LOW (ref 12.0–15.0)
Immature Granulocytes: 0 %
Lymphocytes Relative: 32 %
Lymphs Abs: 2.5 10*3/uL (ref 0.7–4.0)
MCH: 23.7 pg — ABNORMAL LOW (ref 26.0–34.0)
MCHC: 30.6 g/dL (ref 30.0–36.0)
MCV: 77.3 fL — ABNORMAL LOW (ref 80.0–100.0)
Monocytes Absolute: 0.9 10*3/uL (ref 0.1–1.0)
Monocytes Relative: 12 %
Neutro Abs: 4.2 10*3/uL (ref 1.7–7.7)
Neutrophils Relative %: 53 %
Platelets: 179 10*3/uL (ref 150–400)
RBC: 4.31 MIL/uL (ref 3.87–5.11)
RDW: 19.7 % — ABNORMAL HIGH (ref 11.5–15.5)
WBC: 7.9 10*3/uL (ref 4.0–10.5)
nRBC: 0 % (ref 0.0–0.2)

## 2022-09-10 LAB — COMPREHENSIVE METABOLIC PANEL
ALT: 15 U/L (ref 0–44)
AST: 18 U/L (ref 15–41)
Albumin: 3.4 g/dL — ABNORMAL LOW (ref 3.5–5.0)
Alkaline Phosphatase: 66 U/L (ref 38–126)
Anion gap: 8 (ref 5–15)
BUN: <5 mg/dL — ABNORMAL LOW (ref 6–20)
CO2: 23 mmol/L (ref 22–32)
Calcium: 9.4 mg/dL (ref 8.9–10.3)
Chloride: 104 mmol/L (ref 98–111)
Creatinine, Ser: 0.68 mg/dL (ref 0.44–1.00)
GFR, Estimated: >60 mL/min (ref >60)
Glucose, Bld: 93 mg/dL (ref 70–99)
Potassium: 3.6 mmol/L (ref 3.5–5.1)
Sodium: 135 mmol/L (ref 135–145)
Total Bilirubin: 0.4 mg/dL (ref 0.3–1.2)
Total Protein: 6.6 g/dL (ref 6.5–8.1)

## 2022-09-10 LAB — URINALYSIS, ROUTINE W REFLEX MICROSCOPIC
Bacteria, UA: NONE SEEN
Bilirubin Urine: NEGATIVE
Glucose, UA: NEGATIVE mg/dL
Ketones, ur: NEGATIVE mg/dL
Leukocytes,Ua: NEGATIVE
Nitrite: NEGATIVE
Protein, ur: NEGATIVE mg/dL
Specific Gravity, Urine: 1.008 (ref 1.005–1.030)
pH: 6 (ref 5.0–8.0)

## 2022-09-10 MED ORDER — LIDOCAINE-EPINEPHRINE (PF) 2 %-1:200000 IJ SOLN
10.0000 mL | Freq: Once | INTRAMUSCULAR | Status: AC
  Administered 2022-09-10: 10 mL
  Filled 2022-09-10: qty 20

## 2022-09-10 MED ORDER — DOXYCYCLINE HYCLATE 100 MG PO TABS
100.0000 mg | ORAL_TABLET | Freq: Once | ORAL | Status: AC
  Administered 2022-09-10: 100 mg via ORAL
  Filled 2022-09-10: qty 1

## 2022-09-10 NOTE — ED Triage Notes (Signed)
Pt reports abscess on her vagina for the past 2-3 days. Reports some drainage. Denies fever.

## 2022-09-10 NOTE — ED Notes (Signed)
I+D tray and [place] available for MD at bedside

## 2022-09-10 NOTE — ED Provider Notes (Signed)
[facility] EMERGENCY DEPARTMENT AT [facility] Provider Note   CSN: [id number] Arrival date & time: 09/10/22  1509     History {Add pertinent medical, surgical, social history, OB history to HPI:1} Chief Complaint  Patient presents with  . Abscess    [name] is a 52 y.o. female.  Patient is a 52 yo female presenting for vaginal pain with concerns for abscess. Pt admits to swelling and pain on the labia majoria that started 4-5 days ago. States she has had some draining that started yesterday. Denies fevers or chills.   The history is provided by the patient. No language interpreter was used.  Abscess Associated symptoms: no fever and no vomiting        Home Medications Prior to Admission medications   Medication Sig Start Date End Date Taking? Authorizing Provider  cyclobenzaprine (FLEXERIL) 10 MG tablet Take 1 tablet (10 mg total) by mouth 2 (two) times daily as needed for muscle spasms. 11/14/20   Alvira Monday, MD  metroNIDAZOLE (FLAGYL) 500 MG tablet Take 1 tablet (500 mg total) by mouth 2 (two) times daily. One po bid x 7 days 09/29/19   Fayrene Helper, PA-C  nabumetone (RELAFEN) 500 MG tablet Take 1 tablet (500 mg total) by mouth 2 (two) times daily. Patient not taking: Reported on 11/11/2016 09/18/16   Tyrell Antonio, MD  naproxen sodium (ALEVE) 220 MG tablet Take 220 mg by mouth 2 (two) times daily as needed (pain).    [provider]  pantoprazole (PROTONIX) 20 MG tablet Take 2 tablets (40 mg total) by mouth daily for 14 days. 11/14/20 11/28/20  Alvira Monday, MD  valACYclovir (VALTREX) 1000 MG tablet Take 1 tablet (1,000 mg total) by mouth 2 (two) times daily. 09/29/19   Fayrene Helper, PA-C      Allergies    Patient has no known allergies.    Review of Systems   Review of Systems  Constitutional:  Negative for chills and fever.  HENT:  Negative for ear pain and sore throat.   Eyes:  Negative for pain and visual disturbance.  Respiratory:   Negative for cough and shortness of breath.   Cardiovascular:  Negative for chest pain and palpitations.  Gastrointestinal:  Negative for abdominal pain and vomiting.  Genitourinary:  Positive for vaginal pain. Negative for dysuria and hematuria.  Musculoskeletal:  Negative for arthralgias and back pain.  Skin:  Positive for wound. Negative for color change and rash.  Neurological:  Negative for seizures and syncope.  All other systems reviewed and are negative.   Physical Exam Updated Vital Signs BP (!) 142/94   Pulse 73   Temp 98.4 F (36.9 C) (Oral)   Resp 16   Ht 5\' 2"  (1.575 m)   Wt 72.6 kg   SpO2 100%   BMI 29.26 kg/m  Physical Exam Vitals and nursing note reviewed. Exam conducted with a chaperone present.  Constitutional:      General: She is not in acute distress.    Appearance: She is well-developed.  HENT:     Head: Normocephalic and atraumatic.  Eyes:     Conjunctiva/sclera: Conjunctivae normal.  Cardiovascular:     Rate and Rhythm: Normal rate and regular rhythm.     Heart sounds: No murmur heard. Pulmonary:     Effort: Pulmonary effort is normal. No respiratory distress.     Breath sounds: Normal breath sounds.  Abdominal:     Palpations: Abdomen is soft.  Tenderness: There is no abdominal tenderness.  Genitourinary:    Labia:        Right: No rash, tenderness, lesion or injury.        Left: Tenderness and lesion present.     Musculoskeletal:        General: No swelling.     Cervical back: Neck supple.  Skin:    General: Skin is warm and dry.     Capillary Refill: Capillary refill takes less than 2 seconds.  Neurological:     Mental Status: She is alert.  Psychiatric:        Mood and Affect: Mood normal.    ED Results / Procedures / Treatments   Labs (all labs ordered are listed, but only abnormal results are displayed) Labs Reviewed  COMPREHENSIVE METABOLIC PANEL - Abnormal; Notable for the following components:      Result Value   BUN  <5 (*)    Albumin 3.4 (*)    All other components within normal limits  CBC WITH DIFFERENTIAL/PLATELET - Abnormal; Notable for the following components:   Hemoglobin 10.2 (*)    HCT 33.3 (*)    MCV 77.3 (*)    MCH 23.7 (*)    RDW 19.7 (*)    All other components within normal limits  URINALYSIS, ROUTINE W REFLEX MICROSCOPIC - Abnormal; Notable for the following components:   Hgb urine dipstick MODERATE (*)    All other components within normal limits    EKG None  Radiology No results found.  Procedures Procedures  {Document cardiac monitor, telemetry assessment procedure when appropriate:1}  Medications Ordered in ED Medications  lidocaine-EPINEPHrine (XYLOCAINE W/EPI) 2 %-1:200000 (PF) injection 10 mL (has no administration in time range)  doxycycline (VIBRA-TABS) tablet 100 mg (has no administration in time range)    ED Course/ Medical Decision Making/ A&P   {   Click here for ABCD2, HEART and other calculatorsREFRESH Note before signing :1}                          Medical Decision Making Amount and/or Complexity of Data Reviewed Labs: ordered.  Risk Prescription drug management.   ***  {Document critical care time when appropriate:1} {Document review of labs and clinical decision tools ie heart score, Chads2Vasc2 etc:1}  {Document your independent review of radiology images, and any outside records:1} {Document your discussion with family members, caretakers, and with consultants:1} {Document social determinants of health affecting pt's care:1} {Document your decision making why or why not admission, treatments were needed:1} Final Clinical Impression(s) / ED Diagnoses Final diagnoses:  None    Rx / DC Orders ED Discharge Orders     None

## 2022-09-11 MED ORDER — DOXYCYCLINE HYCLATE 100 MG PO CAPS: 100.0000 mg | ORAL_CAPSULE | Freq: Two times a day (BID) | ORAL | 0 refills | Status: AC

## 2022-09-11 MED ORDER — FLUCONAZOLE 200 MG PO TABS: 200.0000 mg | ORAL_TABLET | Freq: Once | ORAL | 0 refills | Status: AC

## 2022-12-15 DIAGNOSIS — M79671 Pain in right foot: Secondary | ICD-10-CM | POA: Diagnosis not present

## 2022-12-15 DIAGNOSIS — S90851A Superficial foreign body, right foot, initial encounter: Secondary | ICD-10-CM | POA: Diagnosis not present

## 2022-12-15 DIAGNOSIS — M5441 Lumbago with sciatica, right side: Secondary | ICD-10-CM | POA: Diagnosis not present

## 2022-12-15 DIAGNOSIS — Z23 Encounter for immunization: Secondary | ICD-10-CM | POA: Diagnosis not present

## 2022-12-15 DIAGNOSIS — M25551 Pain in right hip: Secondary | ICD-10-CM | POA: Diagnosis not present

## 2022-12-15 DIAGNOSIS — M47816 Spondylosis without myelopathy or radiculopathy, lumbar region: Secondary | ICD-10-CM | POA: Diagnosis not present

## 2022-12-15 DIAGNOSIS — M545 Low back pain, unspecified: Secondary | ICD-10-CM | POA: Diagnosis not present

## 2022-12-15 DIAGNOSIS — R296 Repeated falls: Secondary | ICD-10-CM | POA: Diagnosis not present

## 2022-12-15 DIAGNOSIS — Z556 Problems related to health literacy: Secondary | ICD-10-CM | POA: Diagnosis not present

## 2022-12-17 DIAGNOSIS — M79671 Pain in right foot: Secondary | ICD-10-CM | POA: Diagnosis not present

## 2022-12-17 DIAGNOSIS — S90851D Superficial foreign body, right foot, subsequent encounter: Secondary | ICD-10-CM | POA: Diagnosis not present

## 2022-12-22 DIAGNOSIS — S90851A Superficial foreign body, right foot, initial encounter: Secondary | ICD-10-CM | POA: Diagnosis not present

## 2022-12-22 DIAGNOSIS — Z03823 Encounter for observation for suspected inserted (injected) foreign body ruled out: Secondary | ICD-10-CM | POA: Diagnosis not present

## 2022-12-22 DIAGNOSIS — M795 Residual foreign body in soft tissue: Secondary | ICD-10-CM | POA: Diagnosis not present

## 2022-12-22 DIAGNOSIS — F17210 Nicotine dependence, cigarettes, uncomplicated: Secondary | ICD-10-CM | POA: Diagnosis not present

## 2022-12-22 DIAGNOSIS — S91341A Puncture wound with foreign body, right foot, initial encounter: Secondary | ICD-10-CM | POA: Diagnosis not present

## 2022-12-22 DIAGNOSIS — S90851D Superficial foreign body, right foot, subsequent encounter: Secondary | ICD-10-CM | POA: Diagnosis not present

## 2023-01-05 DIAGNOSIS — K219 Gastro-esophageal reflux disease without esophagitis: Secondary | ICD-10-CM | POA: Diagnosis not present

## 2023-01-05 DIAGNOSIS — Z0000 Encounter for general adult medical examination without abnormal findings: Secondary | ICD-10-CM | POA: Diagnosis not present

## 2023-01-05 DIAGNOSIS — B009 Herpesviral infection, unspecified: Secondary | ICD-10-CM | POA: Diagnosis not present

## 2023-01-05 DIAGNOSIS — Z1239 Encounter for other screening for malignant neoplasm of breast: Secondary | ICD-10-CM | POA: Diagnosis not present

## 2023-01-05 DIAGNOSIS — D219 Benign neoplasm of connective and other soft tissue, unspecified: Secondary | ICD-10-CM | POA: Diagnosis not present

## 2023-01-05 DIAGNOSIS — J069 Acute upper respiratory infection, unspecified: Secondary | ICD-10-CM | POA: Diagnosis not present

## 2023-01-05 DIAGNOSIS — G8929 Other chronic pain: Secondary | ICD-10-CM | POA: Diagnosis not present

## 2023-01-05 DIAGNOSIS — M5441 Lumbago with sciatica, right side: Secondary | ICD-10-CM | POA: Diagnosis not present

## 2023-01-05 DIAGNOSIS — N920 Excessive and frequent menstruation with regular cycle: Secondary | ICD-10-CM | POA: Diagnosis not present

## 2023-12-30 DIAGNOSIS — H5203 Hypermetropia, bilateral: Secondary | ICD-10-CM | POA: Diagnosis not present

## 2023-12-31 DIAGNOSIS — H5213 Myopia, bilateral: Secondary | ICD-10-CM | POA: Diagnosis not present
